# Patient Record
Sex: Female | Born: 1955 | Hispanic: Yes | Marital: Married | Smoking: Never smoker
Health system: Southern US, Community
[De-identification: ages and names within clinical notes are randomized; demographics above are authoritative.]

## PROBLEM LIST (undated history)

## (undated) DIAGNOSIS — E119 Type 2 diabetes mellitus without complications: Secondary | ICD-10-CM

## (undated) DIAGNOSIS — R7302 Impaired glucose tolerance (oral): Secondary | ICD-10-CM

## (undated) DIAGNOSIS — E785 Hyperlipidemia, unspecified: Secondary | ICD-10-CM

## (undated) DIAGNOSIS — M109 Gout, unspecified: Secondary | ICD-10-CM

## (undated) DIAGNOSIS — I10 Essential (primary) hypertension: Secondary | ICD-10-CM

## (undated) HISTORY — DX: Gout, unspecified: M10.9

## (undated) HISTORY — DX: Essential (primary) hypertension: I10

## (undated) HISTORY — DX: Hyperlipidemia, unspecified: E78.5

## (undated) HISTORY — DX: Type 2 diabetes mellitus without complications: E11.9

## (undated) HISTORY — DX: Impaired glucose tolerance (oral): R73.02

---

## 2005-10-16 ENCOUNTER — Ambulatory Visit: Payer: Self-pay | Admitting: Gastroenterology

## 2010-11-07 ENCOUNTER — Ambulatory Visit: Payer: Self-pay | Admitting: Obstetrics and Gynecology

## 2012-08-01 ENCOUNTER — Ambulatory Visit: Payer: Managed Care, Other (non HMO)

## 2012-08-01 ENCOUNTER — Ambulatory Visit (INDEPENDENT_AMBULATORY_CARE_PROVIDER_SITE_OTHER): Payer: Managed Care, Other (non HMO) | Admitting: Family Medicine

## 2012-08-01 VITALS — BP 136/96 | HR 83 | Temp 98.0°F | Resp 16 | Ht 63.0 in | Wt 167.0 lb

## 2012-08-01 DIAGNOSIS — R739 Hyperglycemia, unspecified: Secondary | ICD-10-CM

## 2012-08-01 DIAGNOSIS — L293 Anogenital pruritus, unspecified: Secondary | ICD-10-CM

## 2012-08-01 DIAGNOSIS — N898 Other specified noninflammatory disorders of vagina: Secondary | ICD-10-CM

## 2012-08-01 DIAGNOSIS — B9689 Other specified bacterial agents as the cause of diseases classified elsewhere: Secondary | ICD-10-CM

## 2012-08-01 DIAGNOSIS — M79609 Pain in unspecified limb: Secondary | ICD-10-CM

## 2012-08-01 DIAGNOSIS — M109 Gout, unspecified: Secondary | ICD-10-CM

## 2012-08-01 DIAGNOSIS — R7309 Other abnormal glucose: Secondary | ICD-10-CM

## 2012-08-01 DIAGNOSIS — N760 Acute vaginitis: Secondary | ICD-10-CM

## 2012-08-01 DIAGNOSIS — M79671 Pain in right foot: Secondary | ICD-10-CM

## 2012-08-01 LAB — POCT CBC
Granulocyte percent: 70.4 %G (ref 37–80)
HCT, POC: 47.2 % (ref 37.7–47.9)
MCV: 93.4 fL (ref 80–97)
POC LYMPH PERCENT: 24.2 %L (ref 10–50)
RDW, POC: 13.9 %

## 2012-08-01 LAB — POCT WET PREP WITH KOH
Trichomonas, UA: NEGATIVE
Yeast Wet Prep HPF POC: NEGATIVE

## 2012-08-01 LAB — POCT SEDIMENTATION RATE: POCT SED RATE: 35 mm/hr — AB (ref 0–22)

## 2012-08-01 MED ORDER — METRONIDAZOLE 0.75 % VA GEL: 1.0000 | Freq: Two times a day (BID) | VAGINAL | Status: DC

## 2012-08-01 MED ORDER — COLCHICINE 0.6 MG PO TABS: ORAL_TABLET | ORAL | Status: DC

## 2012-08-01 MED ORDER — IBUPROFEN 600 MG PO TABS: 600.0000 mg | ORAL_TABLET | Freq: Four times a day (QID) | ORAL | Status: DC | PRN

## 2012-08-01 NOTE — Patient Instructions (Addendum)
1. Pain of right foot  POCT SEDIMENTATION RATE, Uric Acid, DG Foot 2 Views Right, ibuprofen (ADVIL,MOTRIN) 600 MG tablet  2. Vaginal itching  POCT Wet Prep with KOH, GC/chlamydia probe amp, genital  3. Hyperglycemia  POCT CBC, POCT glucose (manual entry)  4. Gout of big toe  colchicine 0.6 MG tablet  5. Bacterial vaginosis  metroNIDAZOLE (METROGEL VAGINAL) 0.75 % vaginal gel

## 2012-08-01 NOTE — Progress Notes (Signed)
[address]   [place], [place]  [address]   [phone]  Subjective:    Patient ID: Monique Parker, female    DOB: 05/16/1956, 56 y.o.   MRN: [id number]  HPIThis 56 y.o. female presents for evaluation of the following:  1.  Gout:  R first toe pain; onset three days ago with pain.  Similar symptoms in past; diagnosed with gout.  Yesterday developed redness in R first MTP joint.  Last night suffered with horrible pain in toe.  +numbness in toes and painful.  Swollen.  Similar symptoms three years ago.  Second episode.  No alcohol.  Eating shrimp recently; rare red meat.  Eating a lot of beans.  No fever/chills/sweats.  Previously prescribed Allopurinol and Colchicine.    2. Vaginal itching:  Onset yesterday.  No vaginal discharge.  No recent antibiotics.  No dysuria.  No frequency.  Married; no new sexual partners.  Urgency.  No medications OTC.  Blood sugar running 90-120 fasting.  PCP:  Suzie Portela at [facility]; now assigned to new physician.  Looking for new PCP.   Review of Systems  Constitutional: Negative for fever, chills and fatigue.  Gastrointestinal: Negative for nausea, vomiting, abdominal pain and diarrhea.  Genitourinary: Positive for urgency. Negative for dysuria, frequency, hematuria, flank pain, vaginal bleeding, vaginal discharge and vaginal pain.  Musculoskeletal: Positive for joint swelling, arthralgias and gait problem.  Skin: Negative for rash.        Past Medical History  Diagnosis Date  . Hypertension   . Hyperlipidemia   . Gout   . Glucose intolerance (impaired glucose tolerance)   . Diabetes mellitus without complication     History reviewed. No pertinent past surgical history.  Prior to Admission medications   Medication Sig Start Date End Date Taking? Authorizing Provider  metFORMIN (GLUCOPHAGE) 500 MG tablet Take 500 mg by mouth daily with breakfast.   Yes Historical Provider, MD  simvastatin (ZOCOR) 20 MG tablet Take 20 mg by mouth every evening.   Yes  Historical Provider, MD  triamterene-hydrochlorothiazide (DYAZIDE) 37.5-25 MG per capsule Take 1 capsule by mouth every morning.   Yes Historical Provider, MD    Allergies  Allergen Reactions  . Penicillins Swelling    History   Social History  . Marital Status: Unknown    Spouse Name: N/A    Number of Children: N/A  . Years of Education: N/A   Occupational History  . Medical Assistant    Social History Main Topics  . Smoking status: Never Smoker   . Smokeless tobacco: Not on file  . Alcohol Use: No  . Drug Use: No  . Sexually Active: Yes   Other Topics Concern  . Not on file   Social History Narrative   Marital status: married   Children: none   Lives: with husband.  Employment:  Production designer, theatre/television/film at [employer]    Family History  Problem Relation Age of Onset  . Cancer Mother     Brain cancer  . Seizures Mother   . Diabetes Mother   . Arthritis Mother     Objective:   Physical Exam  Nursing note and vitals reviewed. Constitutional: She is oriented to person, place, and time. She appears well-developed and well-nourished. No distress.  Cardiovascular: Normal rate, regular rhythm and normal heart sounds.  Exam reveals no gallop and no friction rub.   No murmur heard. Pulmonary/Chest: Effort normal and breath sounds normal. She has no wheezes. She has no rales.  Abdominal: Soft. Bowel  sounds are normal. She exhibits no mass. There is no tenderness. There is no rebound and no guarding.  Genitourinary: Vagina normal and uterus normal. There is no rash, tenderness or lesion on the right labia. There is no rash, tenderness or lesion on the left labia. Cervix exhibits no motion tenderness, no discharge and no friability. Right adnexum displays no mass, no tenderness and no fullness. Left adnexum displays no mass, no tenderness and no fullness. No erythema, tenderness or bleeding around the vagina. No foreign body around the vagina. No vaginal discharge found.  Musculoskeletal:        R foot:  First MTP with swelling, warmth, erythema.  Decreased ROM first toe of R foot.  Non-tender to palpation of metatarsals. Non-tender metatarsal squeeze.  Neurological: She is alert and oriented to person, place, and time.  Skin: No rash noted. She is not diaphoretic. There is erythema. No pallor.  Psychiatric: She has a normal mood and affect. Her behavior is normal. Judgment and thought content normal.      [facility] reading (PRIMARY) by  Dr. Katrinka Blazing.  R foot:  NAD.   Results for orders placed in visit on 08/01/12  POCT CBC      Component Value Range   WBC 11.9 (*) 4.6 - 10.2 K/uL   Lymph, poc 2.9  0.6 - 3.4   POC LYMPH PERCENT 24.2  10 - 50 %L   MID (cbc) 0.6  0 - 0.9   POC MID % 5.4  0 - 12 %M   POC Granulocyte 8.4 (*) 2 - 6.9   Granulocyte percent 70.4  37 - 80 %G   RBC 5.05  4.04 - 5.48 M/uL   Hemoglobin 14.6  12.2 - 16.2 g/dL   HCT, POC 16.1  09.6 - 47.9 %   MCV 93.4  80 - 97 fL   MCH, POC 28.9  27 - 31.2 pg   MCHC 30.9 (*) 31.8 - 35.4 g/dL   RDW, POC 04.5     Platelet Count, POC 302  142 - 424 K/uL   MPV 10.7  0 - 99.8 fL  GLUCOSE, POCT (MANUAL RESULT ENTRY)      Component Value Range   POC Glucose 56 (*) 70 - 99 mg/dl  POCT WET PREP WITH KOH      Component Value Range   Trichomonas, UA Negative     Clue Cells Wet Prep HPF POC 4-12     Epithelial Wet Prep HPF POC 16-24     Yeast Wet Prep HPF POC neg     Bacteria Wet Prep HPF POC 3+     RBC Wet Prep HPF POC 3-9     WBC Wet Prep HPF POC 4-12     KOH Prep POC Negative       Assessment & Plan:   1. Pain of right foot  POCT SEDIMENTATION RATE, Uric Acid, DG Foot 2 Views Right, ibuprofen (ADVIL,MOTRIN) 600 MG tablet  2. Vaginal itching  POCT Wet Prep with KOH, GC/chlamydia probe amp, genital  3. Hyperglycemia  POCT CBC, POCT glucose (manual entry)  4. Gout of big toe  colchicine 0.6 MG tablet  5. Bacterial vaginosis  metroNIDAZOLE (METROGEL VAGINAL) 0.75 % vaginal gel     1.  Pain R first toe:  New.  Secondary  to acute gouty attack.  Rx for Ibuprofen 600mg  tid PRN. 2.  Acute gouty attack:  New.  Obtain Uric acid level. Rx for Colchicine 0.6mg  one po tid PRN.  First gouty attack in two years. 3.  Bacterial Vaginosis:  New.  Rx for Metrogel vaginal suppositories.  Call if no improvement after treatment. 4.  DMII: controlled with hypoglycemia currently; advised to eat after visit.

## 2012-08-02 ENCOUNTER — Other Ambulatory Visit: Payer: Self-pay | Admitting: Family Medicine

## 2012-08-03 LAB — GC/CHLAMYDIA PROBE AMP
CT Probe RNA: NEGATIVE
GC Probe RNA: NEGATIVE

## 2012-08-05 NOTE — Progress Notes (Signed)
Called patient to schedule appt. With Dr. Katrinka Blazing but patient can only come on Sundays so she prefers to walk in

## 2012-08-07 NOTE — Progress Notes (Signed)
Reviewed and agree.

## 2012-08-29 ENCOUNTER — Ambulatory Visit (INDEPENDENT_AMBULATORY_CARE_PROVIDER_SITE_OTHER): Payer: Managed Care, Other (non HMO) | Admitting: Emergency Medicine

## 2012-08-29 VITALS — BP 148/87 | HR 111 | Temp 98.4°F | Resp 16 | Ht 63.0 in | Wt 165.2 lb

## 2012-08-29 DIAGNOSIS — B029 Zoster without complications: Secondary | ICD-10-CM

## 2012-08-29 DIAGNOSIS — M109 Gout, unspecified: Secondary | ICD-10-CM

## 2012-08-29 DIAGNOSIS — I10 Essential (primary) hypertension: Secondary | ICD-10-CM

## 2012-08-29 MED ORDER — TRIAMTERENE-HCTZ 37.5-25 MG PO CAPS: 1.0000 | ORAL_CAPSULE | Freq: Every day | ORAL | Status: DC

## 2012-08-29 MED ORDER — COLCHICINE 0.6 MG PO TABS: ORAL_TABLET | ORAL | Status: DC

## 2012-08-29 MED ORDER — INDOMETHACIN 25 MG PO CAPS: 25.0000 mg | ORAL_CAPSULE | Freq: Three times a day (TID) | ORAL | Status: DC

## 2012-08-29 MED ORDER — ALLOPURINOL 100 MG PO TABS: 100.0000 mg | ORAL_TABLET | Freq: Every day | ORAL | Status: DC

## 2012-08-29 MED ORDER — VALACYCLOVIR HCL 1 G PO TABS: 1000.0000 mg | ORAL_TABLET | Freq: Two times a day (BID) | ORAL | Status: DC

## 2012-08-29 NOTE — Patient Instructions (Signed)
Gout Gout is an inflammatory condition (arthritis) caused by a buildup of uric acid crystals in the joints. Uric acid is a chemical that is normally present in the blood. Under some circumstances, uric acid can form into crystals in your joints. This causes joint redness, soreness, and swelling (inflammation). Repeat attacks are common. Over time, uric acid crystals can form into masses (tophi) near a joint, causing disfigurement. Gout is treatable and often preventable. CAUSES  The disease begins with elevated levels of uric acid in the blood. Uric acid is produced by your body when it breaks down a naturally found substance called purines. This also happens when you eat certain foods such as meats and fish. Causes of an elevated uric acid level include:  Being passed down from parent to child (heredity).  Diseases that cause increased uric acid production (obesity, psoriasis, some cancers).  Excessive alcohol use.  Diet, especially diets rich in meat and seafood.  Medicines, including certain cancer-fighting drugs (chemotherapy), diuretics, and aspirin.  Chronic kidney disease. The kidneys are no longer able to remove uric acid well.  Problems with metabolism. Conditions strongly associated with gout include:  Obesity.  High blood pressure.  High cholesterol.  Diabetes. Not everyone with elevated uric acid levels gets gout. It is not understood why some people get gout and others do not. Surgery, joint injury, and eating too much of certain foods are some of the factors that can lead to gout. SYMPTOMS   An attack of gout comes on quickly. It causes intense pain with redness, swelling, and warmth in a joint.  Fever can occur.  Often, only one joint is involved. Certain joints are more commonly involved:  Base of the big toe.  Knee.  Ankle.  Wrist.  Finger. Without treatment, an attack usually goes away in a few days to weeks. Between attacks, you usually will not have  symptoms, which is different from many other forms of arthritis. DIAGNOSIS  Your caregiver will suspect gout based on your symptoms and exam. Removal of fluid from the joint (arthrocentesis) is done to check for uric acid crystals. Your caregiver will give you a medicine that numbs the area (local anesthetic) and use a needle to remove joint fluid for exam. Gout is confirmed when uric acid crystals are seen in joint fluid, using a special microscope. Sometimes, blood, urine, and X-ray tests are also used. TREATMENT  There are 2 phases to gout treatment: treating the sudden onset (acute) attack and preventing attacks (prophylaxis). Treatment of an Acute Attack  Medicines are used. These include anti-inflammatory medicines or steroid medicines.  An injection of steroid medicine into the affected joint is sometimes necessary.  The painful joint is rested. Movement can worsen the arthritis.  You may use warm or cold treatments on painful joints, depending which works best for you.  Discuss the use of coffee, vitamin C, or cherries with your caregiver. These may be helpful treatment options. Treatment to Prevent Attacks After the acute attack subsides, your caregiver may advise prophylactic medicine. These medicines either help your kidneys eliminate uric acid from your body or decrease your uric acid production. You may need to stay on these medicines for a very long time. The early phase of treatment with prophylactic medicine can be associated with an increase in acute gout attacks. For this reason, during the first few months of treatment, your caregiver may also advise you to take medicines usually used for acute gout treatment. Be sure you understand your caregiver's directions.   You should also discuss dietary treatment with your caregiver. Certain foods such as meats and fish can increase uric acid levels. Other foods such as dairy can decrease levels. Your caregiver can give you a list of foods  to avoid. HOME CARE INSTRUCTIONS   Do not take aspirin to relieve pain. This raises uric acid levels.  Only take over-the-counter or prescription medicines for pain, discomfort, or fever as directed by your caregiver.  Rest the joint as much as possible. When in bed, keep sheets and blankets off painful areas.  Keep the affected joint raised (elevated).  Use crutches if the painful joint is in your leg.  Drink enough water and fluids to keep your urine clear or pale yellow. This helps your body get rid of uric acid. Do not drink alcoholic beverages. They slow the passage of uric acid.  Follow your caregiver's dietary instructions. Pay careful attention to the amount of protein you eat. Your daily diet should emphasize fruits, vegetables, whole grains, and fat-free or low-fat milk products.  Maintain a healthy body weight. SEEK MEDICAL CARE IF:   You have an oral temperature above 102 F (38.9 C).  You develop diarrhea, vomiting, or any side effects from medicines.  You do not feel better in 24 hours, or you are getting worse. SEEK IMMEDIATE MEDICAL CARE IF:   Your joint becomes suddenly more tender and you have:  Chills.  An oral temperature above 102 F (38.9 C), not controlled by medicine. MAKE SURE YOU:   Understand these instructions.  Will watch your condition.  Will get help right away if you are not doing well or get worse. Document Released: 08/15/2000 Document Revised: 11/10/2011 Document Reviewed: 11/26/2009 Exit[employer] Patient Information 2013 [employer].    

## 2012-08-29 NOTE — Progress Notes (Signed)
[facility] [address], [place] [address] [phone]  Date:  08/29/2012   Name:  Monique Parker   DOB:  12/22/1955   MRN:  [id number]  PCP:  No primary provider on file.    Chief Complaint: Rash   History of Present Illness:  Monique Parker is a 56 y.o. very pleasant female patient who presents with the following:  Multiple complaints.  Has a painful pruritic rash on the left back that has been present for 7 days.  vesiclur in nature and now crusted.  No fever or chills but has malaise and myalgias.  Was recently treated for gout and now has recurrence involving both great toes.  Has been using allopurinal for treatment.  No history of injury or overuse  There is no problem list on file for this patient.   Past Medical History  Diagnosis Date  . Hypertension   . Hyperlipidemia   . Gout   . Glucose intolerance (impaired glucose tolerance)   . Diabetes mellitus without complication     No past surgical history on file.  History  Substance Use Topics  . Smoking status: Never Smoker   . Smokeless tobacco: Not on file  . Alcohol Use: No    Family History  Problem Relation Age of Onset  . Cancer Mother     Brain cancer  . Seizures Mother   . Diabetes Mother   . Arthritis Mother     Allergies  Allergen Reactions  . Penicillins Swelling    Medication list has been reviewed and updated.  Current Outpatient Prescriptions on File Prior to Visit  Medication Sig Dispense Refill  . metFORMIN (GLUCOPHAGE) 500 MG tablet Take 500 mg by mouth daily with breakfast.      . metroNIDAZOLE (METROGEL VAGINAL) 0.75 % vaginal gel Place 1 Applicatorful vaginally 2 (two) times daily.  70 g  0  . triamterene-hydrochlorothiazide (DYAZIDE) 37.5-25 MG per capsule Take 1 each (1 capsule total) by mouth daily. Needs office visit/labs  15 capsule  0  . colchicine 0.6 MG tablet One tablet three times daily x 5 days for gouty pain  30 tablet  2  . ibuprofen  (ADVIL,MOTRIN) 600 MG tablet Take 1 tablet (600 mg total) by mouth every 6 (six) hours as needed for pain.  40 tablet  0  . simvastatin (ZOCOR) 20 MG tablet Take 20 mg by mouth every evening.        Review of Systems:  As per HPI, otherwise negative.    Physical Examination: Filed Vitals:   08/29/12 1630  BP: 148/87  Pulse: 111  Temp: 98.4 F (36.9 C)  Resp: 16   Filed Vitals:   08/29/12 1630  Height: 5\' 3"  (1.6 m)  Weight: 165 lb 3.2 oz (74.934 kg)   Body mass index is 29.26 kg/(m^2). Ideal Body Weight: Weight in (lb) to have BMI = 25: 140.8    GEN: WDWN, NAD, Non-toxic, Alert & Oriented x 3 HEENT: Atraumatic, Normocephalic.  Ears and Nose: No external deformity. EXTR: No clubbing/cyanosis/edema NEURO: Normal gait.  PSYCH: Normally interactive. Conversant. Not depressed or anxious appearing.  Calm demeanor.  CHEST:  Benign.  BS-=  Rash characteristic of shingles on left posterior chest Feet:  Great toe MTP joints tender and swollen and erythematous.  Assessment and Plan: Gout Shingles Valtrex colcrys Indocin Follow up as needed  Carmelina Dane, MD

## 2012-08-30 NOTE — Progress Notes (Signed)
Reviewed and agree.

## 2012-10-04 ENCOUNTER — Ambulatory Visit: Payer: Managed Care, Other (non HMO) | Admitting: Family Medicine

## 2012-12-12 ENCOUNTER — Ambulatory Visit (INDEPENDENT_AMBULATORY_CARE_PROVIDER_SITE_OTHER): Payer: Managed Care, Other (non HMO) | Admitting: Internal Medicine

## 2012-12-12 VITALS — BP 171/93 | HR 66 | Temp 97.7°F | Resp 16 | Ht 64.0 in | Wt 164.0 lb

## 2012-12-12 DIAGNOSIS — M109 Gout, unspecified: Secondary | ICD-10-CM

## 2012-12-12 DIAGNOSIS — E785 Hyperlipidemia, unspecified: Secondary | ICD-10-CM | POA: Insufficient documentation

## 2012-12-12 DIAGNOSIS — I10 Essential (primary) hypertension: Secondary | ICD-10-CM

## 2012-12-12 DIAGNOSIS — E119 Type 2 diabetes mellitus without complications: Secondary | ICD-10-CM

## 2012-12-12 DIAGNOSIS — Z6828 Body mass index (BMI) 28.0-28.9, adult: Secondary | ICD-10-CM | POA: Insufficient documentation

## 2012-12-12 DIAGNOSIS — M5412 Radiculopathy, cervical region: Secondary | ICD-10-CM

## 2012-12-12 LAB — COMPREHENSIVE METABOLIC PANEL
Alkaline Phosphatase: 62 U/L (ref 39–117)
BUN: 18 mg/dL (ref 6–23)
Creat: 0.82 mg/dL (ref 0.50–1.10)
Glucose, Bld: 90 mg/dL (ref 70–99)
Total Bilirubin: 0.5 mg/dL (ref 0.3–1.2)

## 2012-12-12 LAB — POCT CBC
HCT, POC: 42.5 % (ref 37.7–47.9)
Lymph, poc: 2.5 (ref 0.6–3.4)
MCH, POC: 29.4 pg (ref 27–31.2)
MCHC: 32.2 g/dL (ref 31.8–35.4)
MCV: 91.1 fL (ref 80–97)
POC Granulocyte: 4 (ref 2–6.9)
POC LYMPH PERCENT: 35.8 %L (ref 10–50)
RDW, POC: 14.5 %
WBC: 7.1 10*3/uL (ref 4.6–10.2)

## 2012-12-12 LAB — LIPID PANEL
Cholesterol: 211 mg/dL — ABNORMAL HIGH (ref 0–200)
Triglycerides: 223 mg/dL — ABNORMAL HIGH (ref <150)

## 2012-12-12 MED ORDER — LISINOPRIL 20 MG PO TABS: 20.0000 mg | ORAL_TABLET | Freq: Every day | ORAL | Status: DC

## 2012-12-12 MED ORDER — ALLOPURINOL 100 MG PO TABS: 100.0000 mg | ORAL_TABLET | Freq: Every day | ORAL | Status: DC

## 2012-12-12 MED ORDER — CYCLOBENZAPRINE HCL 10 MG PO TABS: 10.0000 mg | ORAL_TABLET | Freq: Every day | ORAL | Status: DC

## 2012-12-12 MED ORDER — COLCHICINE 0.6 MG PO TABS: ORAL_TABLET | ORAL | Status: DC

## 2012-12-12 NOTE — Progress Notes (Addendum)
Subjective:    Patient ID: Monique Parker, female    DOB: Oct 23, 1955, 57 y.o.   MRN: [id number]  HPI complaining of pain in the right great toe for 4 days/no known injury/history of gout in his toe with last incident December 2013 No shellfish, no red meat, little high fructose corn syrup, no alcohol Continues on thiazide diuretic Continues allopurinol  Patient Active Problem List  Diagnosis  . Gout  . HTN (hypertension)  . DM (diabetes mellitus)  . Other and unspecified hyperlipidemia  . BMI 28.0-28.9,adult  Current outpatient prescriptions:triamterene -hydrochlorothiazide (DYAZIDE) 37.5-25 MG per capsule, Take 1 each (1 capsule total) by mouth daily., Disp: 30 capsule, Rfl: 5;   allopurinol (ZYLOPRIM) 100 MG tablet, Take 1 tablet (100 mg total) by mouth daily., Disp: 30 tablet, ibuprofen (ADVIL,MOTRIN) 600 MG tablet, Take 1 tablet (600 mg total) by mouth every 6 (six) hours as needed for pain., Disp: 40 tablet, Rfl: 0 metFORMIN (GLUCOPHAGE) 500 MG tablet, Take 500 mg by mouth daily with breakfast., Disp: , Rfl: -simvastatin (ZOCOR) 20 MG tablet, Take 20 mg by mouth every evening  Also complaining of pain in the right shoulder arm and back for one to 2 weeks, worse with activity No problems with sleep/no known injury   Review of Systems No fever chills or night sweats Chest pain or palpitations No edema No vision changes No urinary problems    Objective:   Physical Exam BP 171/93  Pulse 66  Temp(Src) 97.7 F (36.5 C) (Oral)  Resp 16  Ht 5\' 4"  (1.626 m)  Wt 164 lb (74.39 kg)  BMI 28.14 kg/m2 Range of motion of the neck causes pain in the right trapezius and parascapular area with neck extension/other maneuvers were okay No sensory or motor losses in the right upper extremity Pupils equal round reactive to light and accommodation No thyromegaly Heart regular without murmur Lungs clear Right great toe red and swollen and tender to touch or manipulation       Results  for orders placed in visit on 12/12/12  POCT CBC      Result Value Range   WBC 7.1  4.6 - 10.2 K/uL   Lymph, poc 2.5  0.6 - 3.4   POC LYMPH PERCENT 35.8  10 - 50 %L   MID (cbc) 0.6  0 - 0.9   POC MID % 7.9  0 - 12 %M   POC Granulocyte 4.0  2 - 6.9   Granulocyte percent 56.3  37 - 80 %G   RBC 4.66  4.04 - 5.48 M/uL   Hemoglobin 13.7  12.2 - 16.2 g/dL   HCT, POC 82.9  56.2 - 47.9 %   MCV 91.1  80 - 97 fL   MCH, POC 29.4  27 - 31.2 pg   MCHC 32.2  31.8 - 35.4 g/dL   RDW, POC 13.0     Platelet Count, POC 263  142 - 424 K/uL   MPV 10.4  0 - 99.8 fL  POCT GLYCOSYLATED HEMOGLOBIN (HGB A1C)      Result Value Range   Hemoglobin A1C 5.9      Assessment & Plan:  Gout - Plan: POCT CBC, Uric Acid, allopurinol (ZYLOPRIM) 100 MG tablet=increased to BID, colchicine 0.6 MG tablet bid til well  Discontinue diazide HTN (hypertension) - Plan: lisinopril (PRINIVIL,ZESTRIL) 20 MG tablet///home blood pressures to ensure control  DM (diabetes mellitus) - Plan: POCT CBC, Comprehensive metabolic panel, POCT glycosylated hemoglobin (Hb A1C)=controlled!!!  Other and unspecified hyperlipidemia -  Plan: Lipid panel  BMI 28.0-28.9,adult  Cervical radicular pain - Plan: cyclobenzaprine (FLEXERIL) 10 MG tablet/exercises given-f/u 3 weeks for imaging if not better  Meds ordered this encounter  Medications  . cyclobenzaprine (FLEXERIL) 10 MG tablet    Sig: Take 1 tablet (10 mg total) by mouth at bedtime.    Dispense:  30 tablet    Refill:  0  . allopurinol (ZYLOPRIM) 100 MG tablet    Sig: Take 1 tablet (100 mg total) by mouth daily.    Dispense:  60 tablet    Refill:  5  . colchicine 0.6 MG tablet    Sig: One twice a day til pain controlled    Dispense:  14 tablet    Refill:  0  . lisinopril (PRINIVIL,ZESTRIL) 20 MG tablet    Sig: Take 1 tablet (20 mg total) by mouth daily.    Dispense:  90 tablet    Refill:  3   Recheck in one to 3 months

## 2012-12-12 NOTE — Patient Instructions (Signed)
Increase allopurinol to 1 tablet twice a day to help prevent another gout attack Stop Dyazide and start lisinopril for blood pressure control Use colchicine 1 tablet twice a day until gout resolves Continue metformin daily Continue Zocor daily Recheck labs in 6 months Exercises for neck plus muscle relaxer Flexeril at bedtime for 2-3 weeks

## 2012-12-13 ENCOUNTER — Encounter: Payer: Self-pay | Admitting: Internal Medicine

## 2012-12-13 MED ORDER — SIMVASTATIN 40 MG PO TABS: 40.0000 mg | ORAL_TABLET | Freq: Every evening | ORAL | Status: DC

## 2013-06-23 ENCOUNTER — Ambulatory Visit: Payer: Self-pay | Admitting: Physician Assistant

## 2014-03-19 ENCOUNTER — Ambulatory Visit (INDEPENDENT_AMBULATORY_CARE_PROVIDER_SITE_OTHER): Payer: Managed Care, Other (non HMO) | Admitting: Emergency Medicine

## 2014-03-19 VITALS — BP 136/80 | HR 76 | Temp 98.2°F | Resp 20 | Ht 62.5 in | Wt 168.8 lb

## 2014-03-19 DIAGNOSIS — G5760 Lesion of plantar nerve, unspecified lower limb: Secondary | ICD-10-CM

## 2014-03-19 DIAGNOSIS — G5761 Lesion of plantar nerve, right lower limb: Secondary | ICD-10-CM

## 2014-03-19 MED ORDER — NAPROXEN SODIUM 550 MG PO TABS: 550.0000 mg | ORAL_TABLET | Freq: Two times a day (BID) | ORAL | Status: DC

## 2014-03-19 NOTE — Progress Notes (Signed)
[facility] [address], [place] [id number] [phone]  Date:  03/19/2014   Name:  Monique ShihMaribel A Parker   DOB:  04/24/1956   MRN:  [id number]  PCP:  No primary provider on file.    Chief Complaint: Knee Pain   History of Present Illness:  Monique Parker is a 58 y.o. very pleasant female patient who presents with the following:  Patient with history of gout.  Has sudden pain in 2-3td toe on right foot.  No history of injury.  Was wearing sandals when pain started. Denies other complaint or health concern today.   Patient Active Problem List   Diagnosis Date Noted  . Gout 12/12/2012  . HTN (hypertension) 12/12/2012  . DM (diabetes mellitus) 12/12/2012  . Other and unspecified hyperlipidemia 12/12/2012  . BMI 28.0-28.9,adult 12/12/2012    Past Medical History  Diagnosis Date  . Hypertension   . Hyperlipidemia   . Gout   . Glucose intolerance (impaired glucose tolerance)   . Diabetes mellitus without complication     No past surgical history on file.  History  Substance Use Topics  . Smoking status: Never Smoker   . Smokeless tobacco: Not on file  . Alcohol Use: No    Family History  Problem Relation Age of Onset  . Cancer Mother     Brain cancer  . Seizures Mother   . Diabetes Mother   . Arthritis Mother     Allergies  Allergen Reactions  . Penicillins Swelling    Medication list has been reviewed and updated.  Current Outpatient Prescriptions on File Prior to Visit  Medication Sig Dispense Refill  . ibuprofen (ADVIL,MOTRIN) 600 MG tablet Take 1 tablet (600 mg total) by mouth every 6 (six) hours as needed for pain.  40 tablet  0  . allopurinol (ZYLOPRIM) 100 MG tablet Take 1 tablet (100 mg total) by mouth daily.  60 tablet  5  . colchicine 0.6 MG tablet One twice a day til pain controlled  14 tablet  0   No current facility-administered medications on file prior to visit.    Review of Systems:  As per HPI, otherwise  negative.    Physical Examination: Filed Vitals:   03/19/14 1601  BP: 136/80  Pulse: 76  Temp: 98.2 F (36.8 C)  Resp: 20   Filed Vitals:   03/19/14 1601  Height: 5' 2.5" (1.588 m)  Weight: 168 lb 12.8 oz (76.567 kg)   Body mass index is 30.36 kg/(m^2). Ideal Body Weight: Weight in (lb) to have BMI = 25: 138.6   GEN: WDWN, NAD, Non-toxic, Alert & Oriented x 3 HEENT: Atraumatic, Normocephalic.  Ears and Nose: No external deformity. EXTR: No clubbing/cyanosis/edema NEURO: Normal gait.  PSYCH: Normally interactive. Conversant. Not depressed or anxious appearing.  Calm demeanor.  Localized tenderness between 2-3rd MT heads.  No ecchymosis, erythema or swelling.   Assessment and Plan: Morton neuroma Anaprox   Signed,  Phillips OdorJeffery Archana Eckman, MD

## 2014-03-19 NOTE — Patient Instructions (Signed)
Neuroma de Morton (Morton's Neuroma) La neuralgia (dolor nervioso) o neuroma (tumor nervioso benigno [no canceroso]) puede desarrollarse en cualquier nervio interdigital. Los nervios interdigitales (los que se encuentran entre los dedos) del pie viajan por debajo y The Krogerentre los huesos metatarsos (los huesos largos de la parte delantera del pie) y Wayne Bothvan hacia las terminaciones nerviosas de los dedos de los pies. El tercer nervio interdigital es un lugar comn para que se forme un pequeo neuroma denominado neuroma de Morton. Otro nervio generalmente afectado es el cuarto nervio interdigital. Se encuentra aproximadamente en la zona de la planta del pie, debajo del cuarto dedo. Este trastorno se produce con ms frecuencia en las mujeres y generalmente se presenta de un lado. Generalmente se advierte primero como un dolor que se irradia (se disemina) en la planta del pie o hacia los dedos.  CAUSAS La causa de la neuralgia interdigital puede deberse a un traumatismo (lesin causada por un accidente) repetitivo menor como en aquellas actividades que causan un golpeteo repetido en el pie (correr, saltar, etc.). Otra de las causas puede ser un calzado inadecuado y la prdida reciente de la almohadilla grasa en la planta del pie. TRATAMIENTO Este trastorno generalmente se resuelve (desaparece) simplemente disminuyendo la actividad, si se considera que esta es la causa. Es beneficioso usar el calzado adecuado. Algunos aparatos ortopdicos (soportes especiales para el pie) como la barra metatarsal generalmente son de gran ayuda. Este trastorno generalmente responde a la terapia conservadora, sin embargo cuando es necesario someterse a una ciruga, el alivio es total. INSTRUCCIONES PARA EL CUIDADO DOMICILIARIO  Aplique hielo sobre la zona sensible durante 15 a 20 minutos, 3 a 4 veces por Comcastda mientras se encuentre despierto, durante los 2 primeros [address]  [id number]. Ponga el hielo en una bolsa plstica y coloque una toalla entre la bolsa  y la piel.  Utilice los medicamentos de venta libre o de prescripcin para Chief Technology Officerel dolor, Environmental health practitionerel malestar o la Passapatanzyfiebre, segn se lo indique el profesional que lo asiste. EST SEGURO QUE:   Comprende las instrucciones para el alta mdica.  Controlar su enfermedad.  Solicitar atencin mdica de inmediato segn las indicaciones. Document Released: 08/18/2005 Document Revised: 11/10/2011 [facility] Patient Information 2015 [place]oundExitCare, MarylandLLC. This information is not intended to replace advice given to you by your health care provider. Make sure you discuss any questions you have with your health care provider.

## 2014-05-21 ENCOUNTER — Other Ambulatory Visit: Payer: Self-pay | Admitting: Emergency Medicine

## 2014-08-22 ENCOUNTER — Ambulatory Visit: Payer: Self-pay | Admitting: Family Medicine

## 2014-09-03 ENCOUNTER — Ambulatory Visit (INDEPENDENT_AMBULATORY_CARE_PROVIDER_SITE_OTHER): Payer: Managed Care, Other (non HMO) | Admitting: Family Medicine

## 2014-09-03 VITALS — BP 120/82 | HR 82 | Temp 97.8°F | Resp 18 | Ht 62.5 in | Wt 171.0 lb

## 2014-09-03 DIAGNOSIS — R7302 Impaired glucose tolerance (oral): Secondary | ICD-10-CM

## 2014-09-03 DIAGNOSIS — K1379 Other lesions of oral mucosa: Secondary | ICD-10-CM

## 2014-09-03 DIAGNOSIS — M10071 Idiopathic gout, right ankle and foot: Secondary | ICD-10-CM

## 2014-09-03 DIAGNOSIS — M79671 Pain in right foot: Secondary | ICD-10-CM

## 2014-09-03 LAB — COMPREHENSIVE METABOLIC PANEL
ALK PHOS: 89 U/L (ref 39–117)
ALT: 17 U/L (ref 0–35)
AST: 27 U/L (ref 0–37)
Albumin: 4.4 g/dL (ref 3.5–5.2)
BILIRUBIN TOTAL: 0.3 mg/dL (ref 0.2–1.2)
BUN: 19 mg/dL (ref 6–23)
CO2: 27 meq/L (ref 19–32)
CREATININE: 0.77 mg/dL (ref 0.50–1.10)
Calcium: 9.7 mg/dL (ref 8.4–10.5)
Chloride: 104 mEq/L (ref 96–112)
Glucose, Bld: 81 mg/dL (ref 70–99)
Potassium: 4.1 mEq/L (ref 3.5–5.3)
Sodium: 140 mEq/L (ref 135–145)
TOTAL PROTEIN: 8 g/dL (ref 6.0–8.3)

## 2014-09-03 LAB — URIC ACID: Uric Acid, Serum: 7 mg/dL (ref 2.4–7.0)

## 2014-09-03 LAB — CBC
HCT: 45 % (ref 36.0–46.0)
Hemoglobin: 15.2 g/dL — ABNORMAL HIGH (ref 12.0–15.0)
MCH: 29.8 pg (ref 26.0–34.0)
MCHC: 33.8 g/dL (ref 30.0–36.0)
MCV: 88.2 fL (ref 78.0–100.0)
MPV: 10.8 fL (ref 8.6–12.4)
PLATELETS: 251 10*3/uL (ref 150–400)
RBC: 5.1 MIL/uL (ref 3.87–5.11)
RDW: 14 % (ref 11.5–15.5)
WBC: 8 10*3/uL (ref 4.0–10.5)

## 2014-09-03 LAB — GLUCOSE, POCT (MANUAL RESULT ENTRY): POC Glucose: 77 mg/dl (ref 70–99)

## 2014-09-03 MED ORDER — COLCHICINE 0.6 MG PO TABS: ORAL_TABLET | ORAL | Status: DC

## 2014-09-03 MED ORDER — CLINDAMYCIN HCL 150 MG PO CAPS
150.0000 mg | ORAL_CAPSULE | Freq: Three times a day (TID) | ORAL | Status: DC
Start: 2014-09-03 — End: 2016-05-10

## 2014-09-03 MED ORDER — ALLOPURINOL 100 MG PO TABS: 100.0000 mg | ORAL_TABLET | Freq: Every day | ORAL | Status: DC

## 2014-09-03 NOTE — Progress Notes (Addendum)
Subjective:  This chart was scribed for Monique Simmer, MD by Haywood Pao, ED Scribe at [facility].The patient was seen in exam room 12 and the patient's care was started at 1:05 PM.   Patient ID: Monique Parker, female    DOB: [birth year], 59 y.o.   MRN: [id number]  09/03/2014  Gout and Dental Pain    HPI  HPI Comments: Monique Parker is a 59 y.o. female who presents to [facility] complaining of dental pain onset two weeks. She went to a maxillary-facial specialist two years ago for pain in the upper part of her mouth. Pt was told her if this  worsens she may need surgery. Two weeks ago she was brushing her teeth and injured the top of her mouth. Her husband noticed she has a sore and bleeding at the site of injury. She has gargled with salt water for relief.  Wound persists.  Pt also complains of gout in her right foot, yesterday she began feeling a pinching sensation in her toe. She does not have pain now It is more red than normal. She is taking colchicine once a day for relief. Pt has a gout flare up 4-5 times a year. Pt is out of her allopurinol which she typically takes. Her last x-ray was 08/01/2012. She needs a prescription refill for colchicine and allopurinol. Pt has had gout for 3 years.  No recent uric acid level by PCP in [place].  Pt currently takes lisinopril and reports having a cough as a side effect.  Diuretic was discontinued due to gout.   Her PCP is at the [facility] in [place], pt also lives in [place]. Pt is pre-diabetic and does have her blood sugar levels checked regularly at [place] urgent care. Pt has not gotten the flu shot and refuses one today.  Requesting sugar being checked due to recent holidays.    Review of Systems  Constitutional: Negative for fever, chills, diaphoresis and fatigue.  HENT: Positive for dental problem and mouth sores. Negative for congestion, drooling, ear discharge, ear pain, postnasal drip, rhinorrhea, sinus pressure, sore  throat, trouble swallowing and voice change.   Respiratory: Positive for cough. Negative for shortness of breath.   Musculoskeletal: Positive for joint swelling and arthralgias.  Skin: Positive for color change. Negative for rash.    Past Medical History  Diagnosis Date  . Hypertension   . Hyperlipidemia   . Gout   . Glucose intolerance (impaired glucose tolerance)   . Diabetes mellitus without complication    History reviewed. No pertinent past surgical history. Allergies  Allergen Reactions  . Penicillins Swelling   Current Outpatient Prescriptions  Medication Sig Dispense Refill  . allopurinol (ZYLOPRIM) 100 MG tablet Take 1 tablet (100 mg total) by mouth daily. 30 tablet 5  . colchicine 0.6 MG tablet One twice to three times daily until gout pain is gone 30 tablet 2  . lisinopril (PRINIVIL,ZESTRIL) 20 MG tablet Take 20 mg by mouth daily.    . clindamycin (CLEOCIN) 150 MG capsule Take 1 capsule (150 mg total) by mouth 3 (three) times daily. 21 capsule 0  . ibuprofen (ADVIL,MOTRIN) 600 MG tablet Take 1 tablet (600 mg total) by mouth every 6 (six) hours as needed for pain. (Patient not taking: Reported on 09/03/2014) 40 tablet 0  . naproxen sodium (ANAPROX DS) 550 MG tablet Take 1 tablet (550 mg total) by mouth 2 (two) times daily with a meal. (Patient not taking: Reported on  09/03/2014) 40 tablet 0  . triamterene-hydrochlorothiazide (DYAZIDE) 37.5-25 MG per capsule Take 1 capsule by mouth daily.     No current facility-administered medications for this visit.       Objective:    BP 120/82 mmHg  Pulse 82  Temp(Src) 97.8 F (36.6 C) (Oral)  Resp 18  Ht 5' 2.5" (1.588 m)  Wt 171 lb (77.565 kg)  BMI 30.76 kg/m2  SpO2 99% Physical Exam  Constitutional: She is oriented to person, place, and time. She appears well-developed and well-nourished. No distress.  HENT:  Head: Normocephalic and atraumatic.  4 mm wound with surrounding swelling with no erythema or fluctuant on the soft  pallet.  Eyes: Conjunctivae and EOM are normal. Pupils are equal, round, and reactive to light.  Neck: Normal range of motion. No thyromegaly present.  Cardiovascular: Normal rate, regular rhythm and normal heart sounds.   No murmur heard. Pulmonary/Chest: Effort normal and breath sounds normal. No respiratory distress. She has no wheezes. She has no rales.  Musculoskeletal:  First R MTP swelling and erythema with no pain/tenderness. There is a bunion formation at first R MTP.  Neurological: She is alert and oriented to person, place, and time. No cranial nerve deficit. She exhibits normal muscle tone. Coordination normal.  Skin: Skin is warm and dry. There is erythema.  Psychiatric: She has a normal mood and affect. Her behavior is normal.  Nursing note and vitals reviewed.  Results for orders placed or performed in visit on 09/03/14  POCT glucose (manual entry)  Result Value Ref Range   POC Glucose 77 70 - 99 mg/dl      Assessment & Plan:   1. Acute idiopathic gout of right foot   2. Pain in right foot   3. Mouth sore   4. Glucose intolerance (impaired glucose tolerance)      1.  R gout with acute exacerbation:  New.  Obtain uric acid level, CBC; refill of Allopurinol and Colchicine provided; reviewed use of both medications. 2.  Pain in R foot at first MTP: Recurrent; secondary to gouty attack. 3.  Mouth sore/lesion traumatic: New.  With delayed healing; rx for Clindamycin provided; continue with warm salt water gargles.  If no improvement in two weeks, follow-up with dentist. 4.  Glucose intolerance; stable despite holiday eating.    Meds ordered this encounter  Medications  . lisinopril (PRINIVIL,ZESTRIL) 20 MG tablet    Sig: Take 20 mg by mouth daily.  . clindamycin (CLEOCIN) 150 MG capsule    Sig: Take 1 capsule (150 mg total) by mouth 3 (three) times daily.    Dispense:  21 capsule    Refill:  0  . colchicine 0.6 MG tablet    Sig: One twice to three times daily  until gout pain is gone    Dispense:  30 tablet    Refill:  2  . allopurinol (ZYLOPRIM) 100 MG tablet    Sig: Take 1 tablet (100 mg total) by mouth daily.    Dispense:  30 tablet    Refill:  5    No Follow-up on file.    I personally performed the services described in this documentation, which was scribed in my presence. The recorded information has been reviewed and considered.   Kristi Paulita Fujita, M.D. [facility] [address] [place], [place]  [address] [phone] phone [phone] fax

## 2014-09-03 NOTE — Patient Instructions (Signed)
Monique Parker  °(Gout) ° [name] es una artritis inflamatoria originada por la acumulación de cristales de ácido úrico en las articulaciones. El ácido úrico es una sustancia química que normalmente se encuentra en la sangre. Cuando los niveles de ácido úrico en la sangre son muy elevados, pueden formarse cristales que se depositan en las articulaciones y los tejidos. Esto causa irritación, dolor e hinchazón (inflamación). La repetición de los ataques es frecuente. Con el tiempo, los cristales de ácido úrico pueden formar masas (tofos) cerca de una articulación, destruyen el hueso y provocan una deformidad [name] es una enfermedad que puede tratarse y con frecuencia puede prevenirse. °CAUSAS  °La enfermedad comienza con niveles altos de ácido úrico en la sangre. El organismo produce ácido úrico cuando metaboliza una sustancia que se encuentra en estado natural, denominada purina. Ciertos alimentos, como carnes y pescado, contienen grandes cantidades de purinas. Las causas de ácido úrico elevado son:  °· Ser transmitida de padres a hijos (hereditaria). °· Enfermedades que causan un aumento de la producción de ácido úrico (como la obesidad, la psoriasis y ciertos tipos de cáncer). °· Abuso en el consumo de bebidas alcohólicas. °· La dieta, especialmente las dietas en las que se consume mucha carne y frutos de mar. °· Ciertos medicamentos, como los que combaten el cáncer (quimioterapia), los diuréticos y la aspirina. °· Enfermedades renales crónicas. Los riñones no pueden eliminar bien el ácido úrico. °· Problemas con el metabolismo. °Las enfermedades fuertemente asociadas a [name] son:  °· Obesidad. °· Hipertensión arterial. °· Colesterol elevado. °· Diabetes. °No todas las personas con niveles elevados de ácido úrico sufren Monique Parker. No se comprende aún por qué algunas personas padecen Monique Parker y otras no. Las cirugías, lesiones en una articulación y el consumo excesivo de ciertos alimentos son algunos de los factores que pueden  provocar ataques de Monique Parker.  °SÍNTOMAS  °· Un ataque de Monique Parker puede comenzar rápidamente. Causa un dolor intenso, con irritación, hinchazón y calor en una articulación. °· Puede haber fiebre. °· Generalmente sólo una articulación es afectada. Ciertas articulaciones se ven implicadas con más frecuencia. °¨ La base del dedo gordo del pie. °¨ La rodilla. °¨ El tobillo. °¨ La muñeca. °¨ Un dedo. °Sin tratamiento, un ataque por lo general desaparece en unos pocos días o semanas. Entre un ataque y otro, por lo general no hay síntomas, lo cual es diferente de muchas otras formas de artritis.  °DIAGNÓSTICO  °El profesional reunirá la información basándose en los síntomas y el examen físico. En algunos casos indicará estudios. Estos estudios pueden ser:  °· Análisis de sangre. °· Análisis de orina. °· Radiografías. °· Análisis de los fluidos de la articulación. En este estudio se necesita una aguja para extraer líquido de la articulación (artrocentesis). Con el uso del microscopio, se confirma [name] cuando se observan los cristales de ácido úrico en el líquido de la articulación. °TRATAMIENTO  °Hay dos fases en el tratamiento de [name]: el tratamiento del ataque de aparición repentina (agudo) y la prevención de los ataques (profilaxis).  °· Tratamiento de un ataque agudo. °¨ Se utilizan medicamentos. Se indican antiinflamatorios o corticoides. °¨ En algunos casos es necesario inyectar un corticoide en la articulación afectada. °¨ La articulación dolorosa se pone en reposo. El movimiento puede empeorar la artritis. °¨ Puede utilizar tanto tratamientos con calor o con frío para aliviar el dolor en las articulaciones, según lo que le haga mejor. °· Tratamiento para prevenir ataques. °¨ Si sufre de ataques de Monique Parker frecuentes,   el médico puede recomendarle medicamentos preventivos. Estos medicamentos se administran después de que el ataque agudo mejora. Estos medicamentos ayudan a los riñones a eliminar el ácido úrico del  organismo o a disminuir la producción de ácido úrico. Es posible que deba utilizar estos medicamentos durante un largo tiempo. °¨ La primera fase del tratamiento preventiva puede asociarse con un aumento de los ataques agudos de Monique Parker. Por esta razón, durante los primeros meses de tratamiento, su médico puede también aconsejarle que tome medicamentos que habitualmente se utilizan para el tratamiento de [name] aguda. Asegúrese de comprender todas las indicaciones del médico. El médico podrá hacer varios ajustes a la dosis del medicamento antes de que comiencen a ser efectivos. °¨ Comente el tratamiento dietético con su médico o nutricionista. El alcohol y las bebidas que contienen gran cantidad de azúcar y fructosa y los alimentos como la carne, el pollo y los frutos de mar pueden aumentar los niveles de ácido úrico. El médico o el nutricionista podrá aconsejarlo sobre las bebidas y los alimentos que debe limitar. °INSTRUCCIONES PARA EL CUIDADO EN EL HOGAR  °· No tome aspirina para el dolor. Esto eleva los niveles de ácido úrico. °· Sólo tome medicamentos de venta libre o prescriptos para calmar el dolor, las molestias o bajar la fiebre según las indicaciones de su médico. °· Haga reposo todo el tiempo que pueda. Cuando se encuentre en la cama, mantenga las sábanas y mantas alejadas de las articulaciones doloridas. °· Mantenga la articulación afectada levantada (elevada). °· Aplique compresas frías o calientes sobre las articulaciones doloridas. el uso de compresas calientes o frías depende de lo que mejor le resulte a usted. °· Utilice muletas si la articulación que le duele es de la pierna. °· Debe ingerir gran cantidad de líquido para mantener la orina de tono claro o color amarillo pálido. Esto ayudará a que el organismo elimine el ácido úrico. Limite el consumo de alcohol, bebidas azucaradas y que contengan fructosa. °· Siga las indicaciones con respecto a la dieta. Preste especial atención a la cantidad de  proteínas que consume. En la dieta diaria debe enfatizar el consumo de frutas, vegetales, granos enteros y productos lácteos descremados. Comente con su médico o nutricionista acerca del consumo de café, vitamina C o cerezas. Estos pueden ayudar a disminuir los niveles de ácido úrico. °· Mantenga un peso corporal adecuado. °SOLICITE ATENCIÓN MÉDICA SI:  °· Tiene diarrea, vómitos o algún efecto secundario provocado por los medicamentos. °· No se siente mejor en 24 horas, o empeora. °SOLICITE ATENCIÓN MÉDICA DE INMEDIATO SI:  °· Las articulaciones le duelen más de manera repentina, tiene escalofríos o fiebre. °ASEGÚRESE DE QUE:  °· Comprende estas instrucciones. °· Controlará su enfermedad. °· Solicitará ayuda de inmediato si no mejora o si empeora. °Document Released: 05/28/2005 Document Revised: 12/13/2012 °ExitCare® Patient Information ©2015 ExitCare, LLC. This information is not intended to replace advice given to you by your health care provider. Make sure you discuss any questions you have with your health care provider. ° °

## 2014-09-05 ENCOUNTER — Encounter: Payer: Self-pay | Admitting: Radiology

## 2014-10-10 ENCOUNTER — Ambulatory Visit: Payer: Self-pay | Admitting: Family Medicine

## 2014-12-03 ENCOUNTER — Other Ambulatory Visit: Payer: Self-pay | Admitting: Family Medicine

## 2014-12-04 NOTE — Telephone Encounter (Signed)
Dr Katrinka BlazingSmith, I can't tell if you Rxd lisinopril for pt at 09/03/14 [facility] or not? The [facility] notes show that you ordered it at that visit, but then further down the list of ordered meds does not include it. You don't have any notes stating that you were Rxing it, and under "Chart Review" it shows up as "historical prescriber" and looks like it wasn't a completed order. Pt had reported at [facility] that she has been taking it and it "makes her cough". Please advise.

## 2015-03-25 ENCOUNTER — Ambulatory Visit (INDEPENDENT_AMBULATORY_CARE_PROVIDER_SITE_OTHER): Payer: Managed Care, Other (non HMO) | Admitting: Family Medicine

## 2015-03-25 VITALS — BP 148/90 | HR 64 | Temp 98.1°F | Resp 18 | Ht 63.5 in | Wt 172.8 lb

## 2015-03-25 DIAGNOSIS — M10071 Idiopathic gout, right ankle and foot: Secondary | ICD-10-CM

## 2015-03-25 DIAGNOSIS — I10 Essential (primary) hypertension: Secondary | ICD-10-CM | POA: Diagnosis not present

## 2015-03-25 DIAGNOSIS — H109 Unspecified conjunctivitis: Secondary | ICD-10-CM | POA: Diagnosis not present

## 2015-03-25 DIAGNOSIS — M25512 Pain in left shoulder: Secondary | ICD-10-CM | POA: Diagnosis not present

## 2015-03-25 DIAGNOSIS — M1000 Idiopathic gout, unspecified site: Secondary | ICD-10-CM

## 2015-03-25 MED ORDER — LISINOPRIL 20 MG PO TABS
20.0000 mg | ORAL_TABLET | Freq: Every day | ORAL | Status: DC
Start: 2015-03-25 — End: 2016-05-10

## 2015-03-25 MED ORDER — ALLOPURINOL 100 MG PO TABS: 100.0000 mg | ORAL_TABLET | Freq: Every day | ORAL | Status: DC

## 2015-03-25 MED ORDER — COLCHICINE 0.6 MG PO TABS: ORAL_TABLET | ORAL | Status: DC

## 2015-03-25 MED ORDER — NEOMYCIN-POLYMYXIN-DEXAMETH 3.5-10000-0.1 OP SUSP: 1.0000 [drp] | Freq: Two times a day (BID) | OPHTHALMIC | Status: DC | PRN

## 2015-03-25 NOTE — Patient Instructions (Signed)
Hypertension Hypertension, commonly called high blood pressure, is when the force of blood pumping through your arteries is too strong. Your arteries are the blood vessels that carry blood from your heart throughout your body. A blood pressure reading consists of a higher number over a lower number, such as 110/72. The higher number (systolic) is the pressure inside your arteries when your heart pumps. The lower number (diastolic) is the pressure inside your arteries when your heart relaxes. Ideally you want your blood pressure below 120/80. Hypertension forces your heart to work harder to pump blood. Your arteries may become narrow or stiff. Having hypertension puts you at risk for heart disease, stroke, and other problems.  RISK FACTORS Some risk factors for high blood pressure are controllable. Others are not.  Risk factors you cannot control include:   Race. You may be at higher risk if you are African American.  Age. Risk increases with age.  Gender. Men are at higher risk than women before age 45 years. After age 65, women are at higher risk than men. Risk factors you can control include:  Not getting enough exercise or physical activity.  Being overweight.  Getting too much fat, sugar, calories, or salt in your diet.  Drinking too much alcohol. SIGNS AND SYMPTOMS Hypertension does not usually cause signs or symptoms. Extremely high blood pressure (hypertensive crisis) may cause headache, anxiety, shortness of breath, and nosebleed. DIAGNOSIS  To check if you have hypertension, your health care provider will measure your blood pressure while you are seated, with your arm held at the level of your heart. It should be measured at least twice using the same arm. Certain conditions can cause a difference in blood pressure between your right and left arms. A blood pressure reading that is higher than normal on one occasion does not mean that you need treatment. If one blood pressure reading  is high, ask your health care provider about having it checked again. TREATMENT  Treating high blood pressure includes making lifestyle changes and possibly taking medicine. Living a healthy lifestyle can help lower high blood pressure. You may need to change some of your habits. Lifestyle changes may include:  Following the DASH diet. This diet is high in fruits, vegetables, and whole grains. It is low in salt, red meat, and added sugars.  Getting at least 2 hours of brisk physical activity every week.  Losing weight if necessary.  Not smoking.  Limiting alcoholic beverages.  Learning ways to reduce stress. If lifestyle changes are not enough to get your blood pressure under control, your health care provider may prescribe medicine. You may need to take more than one. Work closely with your health care provider to understand the risks and benefits. HOME CARE INSTRUCTIONS  Have your blood pressure rechecked as directed by your health care provider.   Take medicines only as directed by your health care provider. Follow the directions carefully. Blood pressure medicines must be taken as prescribed. The medicine does not work as well when you skip doses. Skipping doses also puts you at risk for problems.   Do not smoke.   Monitor your blood pressure at home as directed by your health care provider. SEEK MEDICAL CARE IF:   You think you are having a reaction to medicines taken.  You have recurrent headaches or feel dizzy.  You have swelling in your ankles.  You have trouble with your vision. SEEK IMMEDIATE MEDICAL CARE IF:  You develop a severe headache or confusion.    You have unusual weakness, numbness, or feel faint.  You have severe chest or abdominal pain.  You vomit repeatedly.  You have trouble breathing. MAKE SURE YOU:   Understand these instructions.  Will watch your condition.  Will get help right away if you are not doing well or get worse. Document  Released: 08/18/2005 Document Revised: 01/02/2014 Document Reviewed: 06/10/2013 ExitCare Patient Information 2015 [employer]. This information is not intended to replace advice given to you by your health care provider. Make sure you discuss any questions you have with your health care provider. Gout Gout is an inflammatory arthritis caused by a buildup of uric acid crystals in the joints. Uric acid is a chemical that is normally present in the blood. When the level of uric acid in the blood is too high it can form crystals that deposit in your joints and tissues. This causes joint redness, soreness, and swelling (inflammation). Repeat attacks are common. Over time, uric acid crystals can form into masses (tophi) near a joint, destroying bone and causing disfigurement. Gout is treatable and often preventable. CAUSES  The disease begins with elevated levels of uric acid in the blood. Uric acid is produced by your body when it breaks down a naturally found substance called purines. Certain foods you eat, such as meats and fish, contain high amounts of purines. Causes of an elevated uric acid level include:  Being passed down from parent to child (heredity).  Diseases that cause increased uric acid production (such as obesity, psoriasis, and certain cancers).  Excessive alcohol use.  Diet, especially diets rich in meat and seafood.  Medicines, including certain cancer-fighting medicines (chemotherapy), water pills (diuretics), and aspirin.  Chronic kidney disease. The kidneys are no longer able to remove uric acid well.  Problems with metabolism. Conditions strongly associated with gout include:  Obesity.  High blood pressure.  High cholesterol.  Diabetes. Not everyone with elevated uric acid levels gets gout. It is not understood why some people get gout and others do not. Surgery, joint injury, and eating too much of certain foods are some of the factors that can lead to gout  attacks. SYMPTOMS   An attack of gout comes on quickly. It causes intense pain with redness, swelling, and warmth in a joint.  Fever can occur.  Often, only one joint is involved. Certain joints are more commonly involved:  Base of the big toe.  Knee.  Ankle.  Wrist.  Finger. Without treatment, an attack usually goes away in a few days to weeks. Between attacks, you usually will not have symptoms, which is different from many other forms of arthritis. DIAGNOSIS  Your caregiver will suspect gout based on your symptoms and exam. In some cases, tests may be recommended. The tests may include:  Blood tests.  Urine tests.  X-rays.  Joint fluid exam. This exam requires a needle to remove fluid from the joint (arthrocentesis). Using a microscope, gout is confirmed when uric acid crystals are seen in the joint fluid. TREATMENT  There are two phases to gout treatment: treating the sudden onset (acute) attack and preventing attacks (prophylaxis).  Treatment of an Acute Attack.  Medicines are used. These include anti-inflammatory medicines or steroid medicines.  An injection of steroid medicine into the affected joint is sometimes necessary.  The painful joint is rested. Movement can worsen the arthritis.  You may use warm or cold treatments on painful joints, depending which works best for you.  Treatment to Prevent Attacks.  If you   suffer from frequent gout attacks, your caregiver may advise preventive medicine. These medicines are started after the acute attack subsides. These medicines either help your kidneys eliminate uric acid from your body or decrease your uric acid production. You may need to stay on these medicines for a very long time.  The early phase of treatment with preventive medicine can be associated with an increase in acute gout attacks. For this reason, during the first few months of treatment, your caregiver may also advise you to take medicines usually used  for acute gout treatment. Be sure you understand your caregiver's directions. Your caregiver may make several adjustments to your medicine dose before these medicines are effective.  Discuss dietary treatment with your caregiver or dietitian. Alcohol and drinks high in sugar and fructose and foods such as meat, poultry, and seafood can increase uric acid levels. Your caregiver or dietitian can advise you on drinks and foods that should be limited. HOME CARE INSTRUCTIONS   Do not take aspirin to relieve pain. This raises uric acid levels.  Only take over-the-counter or prescription medicines for pain, discomfort, or fever as directed by your caregiver.  Rest the joint as much as possible. When in bed, keep sheets and blankets off painful areas.  Keep the affected joint raised (elevated).  Apply warm or cold treatments to painful joints. Use of warm or cold treatments depends on which works best for you.  Use crutches if the painful joint is in your leg.  Drink enough fluids to keep your urine clear or pale yellow. This helps your body get rid of uric acid. Limit alcohol, sugary drinks, and fructose drinks.  Follow your dietary instructions. Pay careful attention to the amount of protein you eat. Your daily diet should emphasize fruits, vegetables, whole grains, and fat-free or low-fat milk products. Discuss the use of coffee, vitamin C, and cherries with your caregiver or dietitian. These may be helpful in lowering uric acid levels.  Maintain a healthy body weight. SEEK MEDICAL CARE IF:   You develop diarrhea, vomiting, or any side effects from medicines.  You do not feel better in 24 hours, or you are getting worse. SEEK IMMEDIATE MEDICAL CARE IF:   Your joint becomes suddenly more tender, and you have chills or a fever. MAKE SURE YOU:   Understand these instructions.  Will watch your condition.  Will get help right away if you are not doing well or get worse. Document Released:  08/15/2000 Document Revised: 01/02/2014 Document Reviewed: 03/31/2012 ExitCare Patient Information 2015 [employer]. This information is not intended to replace advice given to you by your health care provider. Make sure you discuss any questions you have with your health care provider.  

## 2015-03-25 NOTE — Addendum Note (Signed)
Addended by: Elvina Sidle on: 03/25/2015 02:15 PM   Modules accepted: Orders

## 2015-03-25 NOTE — Progress Notes (Addendum)
This chart was scribed for Elvina Sidle, MD by Stann Ore, medical scribe at [facility].The patient was seen in exam room 6 and the patient's care was started at 1:27 PM.  Patient ID: Monique Parker MRN: [id number], DOB: 05/07/1956, 59 y.o. Date of Encounter: 03/25/2015  Primary Physician: No PCP Per Patient  Chief Complaint:  Chief Complaint  Patient presents with   Toe Pain    both feet    Shoulder Pain    left shoulder     HPI:  Monique Parker is a 59 y.o. female who presents to [facility] complaining of pain in both toes.  She believes it's gout as she's had similar symptoms in the past. She has taken some ibuprofen.  She was in the office before and was prescribed colchicine and allopurinol.   She also complains of pain in her left shoulder. She has no pain in her left elbow.  She also says that her eyes have been itching for the past 2 weeks. She denies eye discharge.   She works as Production designer, theatre/television/film for [employer].   Past Medical History  Diagnosis Date   Hypertension    Hyperlipidemia    Gout    Glucose intolerance (impaired glucose tolerance)    Diabetes mellitus without complication      Home Meds: Prior to Admission medications   Medication Sig Start Date End Date Taking? Authorizing Provider  allopurinol (ZYLOPRIM) 100 MG tablet Take 1 tablet (100 mg total) by mouth daily. 09/03/14  Yes Ethelda Chick, MD  colchicine 0.6 MG tablet One twice to three times daily until gout pain is gone 09/03/14  Yes Ethelda Chick, MD  lisinopril (PRINIVIL,ZESTRIL) 20 MG tablet Take 20 mg by mouth daily.   Yes Historical Provider, MD  METFORMIN HCL PO Take 5 mg by mouth.   Yes Historical Provider, MD  clindamycin (CLEOCIN) 150 MG capsule Take 1 capsule (150 mg total) by mouth 3 (three) times daily. 09/03/14   Ethelda Chick, MD    Allergies:  Allergies  Allergen Reactions   Penicillins Swelling    History   Social History     Marital Status: Married    Spouse Name: N/A   Number of Children: N/A   Years of Education: N/A   Occupational History   Engineer, site    Social History Main Topics   Smoking status: Never Smoker    Smokeless tobacco: Not on file   Alcohol Use: No   Drug Use: No   Sexual Activity: Yes   Other Topics Concern   Not on file   Social History Narrative   Marital status: married      Children: none      Lives: with husband.     Employment:  employed     Review of Systems: Constitutional: negative for chills, fever, night sweats, weight changes, or fatigue  HEENT: negative for vision changes, hearing loss, congestion, rhinorrhea, ST, epistaxis, or sinus pressure; positive for eye discharge Cardiovascular: negative for chest pain or palpitations Respiratory: negative for hemoptysis, wheezing, shortness of breath, or cough Abdominal: negative for abdominal pain, nausea, vomiting, diarrhea, or constipation Dermatological: negative for rash Neurologic: negative for headache, dizziness, or syncope Musc: positive for toe pain (both), shoulder pain (left)  All other systems reviewed and are otherwise negative with the exception to those above and in the HPI.  Physical Exam: Blood pressure 148/90, pulse 64, temperature 98.1 F (  36.7 C), temperature source Oral, resp. rate 18, height 5' 3.5" (1.613 m), weight 172 lb 12.8 oz (78.382 kg), SpO2 99 %., Body mass index is 30.13 kg/(m^2). General: Well developed, well nourished, in no acute distress. Head: Normocephalic, atraumatic, eyes without discharge, sclera non-icteric, nares are without discharge. Bilateral auditory canals clear, TM's are without perforation, pearly grey and translucent with reflective cone of light bilaterally. Oral cavity moist, posterior pharynx without exudate, erythema, peritonsillar abscess, or post nasal drip.  Neck: Supple. No thyromegaly. Full ROM. No lymphadenopathy. Lungs: Clear bilaterally to  auscultation without wheezes, rales, or rhonchi. Breathing is unlabored. Heart: RRR with S1 S2. No murmurs, rubs, or gallops appreciated. Abdomen: Soft, non-tender, non-distended with normoactive bowel sounds. No hepatomegaly. No rebound/guarding. No obvious abdominal masses. Msk:  Strength and tone normal for age; bilateral bunion pain and tenderness over [facility] joint Extremities/Skin: Warm and dry. No clubbing or cyanosis. No rashes or suspicious lesions.  Both MTP joints of the great toes show bunions with marked lateral deviation and erythema with mild tenderness. Neuro: Alert and oriented X 3. Moves all extremities spontaneously. Gait is normal. CNII-XII grossly in tact. Psych:  Responds to questions appropriately with a normal affect.    ASSESSMENT AND PLAN:  59 y.o. year old female with  This chart was scribed in my presence and reviewed by me personally.    ICD-9-CM ICD-10-CM   1. Acute idiopathic gout, unspecified site 274.01 M10.00 colchicine 0.6 MG tablet     allopurinol (ZYLOPRIM) 100 MG tablet  2. Essential hypertension 401.9 I10 lisinopril (PRINIVIL,ZESTRIL) 20 MG tablet  3. Pain in joint, shoulder region, left 719.41 M25.512   4. Acute idiopathic gout of right foot 274.01 M10.071 colchicine 0.6 MG tablet     allopurinol (ZYLOPRIM) 100 MG tablet  5. Bilateral conjunctivitis 372.30 H10.9 neomycin-polymyxin b-dexamethasone (MAXITROL) 3.5-10000-0.1 SUSP     Signed, Elvina Sidle, MD 03/25/2015 2:15 PM

## 2016-01-30 ENCOUNTER — Other Ambulatory Visit: Payer: Self-pay | Admitting: Physician Assistant

## 2016-01-30 DIAGNOSIS — Z0000 Encounter for general adult medical examination without abnormal findings: Secondary | ICD-10-CM

## 2016-05-10 ENCOUNTER — Ambulatory Visit (INDEPENDENT_AMBULATORY_CARE_PROVIDER_SITE_OTHER): Payer: Managed Care, Other (non HMO) | Admitting: Physician Assistant

## 2016-05-10 VITALS — BP 118/72 | HR 59 | Temp 97.9°F | Resp 18 | Ht 63.5 in | Wt 170.8 lb

## 2016-05-10 DIAGNOSIS — I10 Essential (primary) hypertension: Secondary | ICD-10-CM | POA: Diagnosis not present

## 2016-05-10 DIAGNOSIS — M10071 Idiopathic gout, right ankle and foot: Secondary | ICD-10-CM

## 2016-05-10 DIAGNOSIS — Z1159 Encounter for screening for other viral diseases: Secondary | ICD-10-CM | POA: Diagnosis not present

## 2016-05-10 DIAGNOSIS — E119 Type 2 diabetes mellitus without complications: Secondary | ICD-10-CM

## 2016-05-10 DIAGNOSIS — Z114 Encounter for screening for human immunodeficiency virus [HIV]: Secondary | ICD-10-CM | POA: Diagnosis not present

## 2016-05-10 DIAGNOSIS — R001 Bradycardia, unspecified: Secondary | ICD-10-CM

## 2016-05-10 DIAGNOSIS — Z0000 Encounter for general adult medical examination without abnormal findings: Secondary | ICD-10-CM

## 2016-05-10 DIAGNOSIS — M1000 Idiopathic gout, unspecified site: Secondary | ICD-10-CM | POA: Diagnosis not present

## 2016-05-10 DIAGNOSIS — Z8639 Personal history of other endocrine, nutritional and metabolic disease: Secondary | ICD-10-CM

## 2016-05-10 LAB — COMPLETE METABOLIC PANEL WITH GFR
ALT: 15 U/L (ref 6–29)
AST: 26 U/L (ref 10–35)
Albumin: 4.5 g/dL (ref 3.6–5.1)
Alkaline Phosphatase: 65 U/L (ref 33–130)
BUN: 22 mg/dL (ref 7–25)
CO2: 27 mmol/L (ref 20–31)
Calcium: 9.4 mg/dL (ref 8.6–10.4)
Chloride: 103 mmol/L (ref 98–110)
Creat: 0.77 mg/dL (ref 0.50–0.99)
GFR, EST NON AFRICAN AMERICAN: 84 mL/min (ref >=60)
GFR, Est African American: >89 mL/min (ref >=60)
GLUCOSE: 108 mg/dL — AB (ref 65–99)
POTASSIUM: 4.3 mmol/L (ref 3.5–5.3)
SODIUM: 142 mmol/L (ref 135–146)
Total Bilirubin: 0.5 mg/dL (ref 0.2–1.2)
Total Protein: 7.7 g/dL (ref 6.1–8.1)

## 2016-05-10 LAB — POCT CBC
Granulocyte percent: 57.9 %G (ref 37–80)
HCT, POC: 41.1 % (ref 37.7–47.9)
Hemoglobin: 14.5 g/dL (ref 12.2–16.2)
Lymph, poc: 2.5 (ref 0.6–3.4)
MCH, POC: 30.7 pg (ref 27–31.2)
MCHC: 35.2 g/dL (ref 31.8–35.4)
MCV: 87 fL (ref 80–97)
MID (CBC): 0.3 (ref 0–0.9)
MPV: 9.1 fL (ref 0–99.8)
PLATELET COUNT, POC: 200 10*3/uL (ref 142–424)
POC Granulocyte: 3.9 (ref 2–6.9)
POC LYMPH %: 37.3 % (ref 10–50)
POC MID %: 4.8 %M (ref 0–12)
RBC: 4.73 M/uL (ref 4.04–5.48)
RDW, POC: 14.3 %
WBC: 6.7 10*3/uL (ref 4.6–10.2)

## 2016-05-10 LAB — HEPATITIS C ANTIBODY: HCV AB: NEGATIVE

## 2016-05-10 LAB — POCT GLYCOSYLATED HEMOGLOBIN (HGB A1C): Hemoglobin A1C: 6.6

## 2016-05-10 LAB — URIC ACID: Uric Acid, Serum: 8.1 mg/dL — ABNORMAL HIGH (ref 2.5–7.0)

## 2016-05-10 LAB — HIV ANTIBODY (ROUTINE TESTING W REFLEX): HIV 1&2 Ab, 4th Generation: NONREACTIVE

## 2016-05-10 LAB — TSH: TSH: 1.51 mIU/L

## 2016-05-10 MED ORDER — LISINOPRIL 20 MG PO TABS: 20.0000 mg | ORAL_TABLET | Freq: Every day | ORAL | 3 refills | Status: DC

## 2016-05-10 MED ORDER — METFORMIN HCL 500 MG PO TABS: 500.0000 mg | ORAL_TABLET | Freq: Every day | ORAL | 1 refills | Status: DC

## 2016-05-10 MED ORDER — COLCHICINE 0.6 MG PO TABS: ORAL_TABLET | ORAL | 2 refills | Status: DC

## 2016-05-10 NOTE — Progress Notes (Signed)
05/10/2016 11:46 AM   DOB: 1956/03/15 / MRN: [id number]  SUBJECTIVE:  Monique Parker is a 60 y.o. female presenting for left first great toe MTP pain.  She would also like refills of all of her medications today.    She has taken colchicine in the past with good relief of her gout pain. She was prescribed allopurinol but has also been taking this for acute flares only, not for prophylaxis. She feels that today's symptoms are similar to past gout symptoms.    She has a history of HTN and takes Lisinopril for this.  Has a history of elevated A1c and takes metformin when she needs it.      She is allergic to penicillins.   She  has a past medical history of Diabetes mellitus without complication (HCC); Glucose intolerance (impaired glucose tolerance); Gout; Hyperlipidemia; and Hypertension.    She  reports that she has never smoked. She does not have any smokeless tobacco history on file. She reports that she does not drink alcohol or use drugs. She  reports that she currently engages in sexual activity. The patient  has no past surgical history on file.  Her family history includes Arthritis in her mother; Cancer in her mother; Diabetes in her mother; Seizures in her mother.  Review of Systems  Constitutional: Negative for fever.  Respiratory: Negative for cough.   Cardiovascular: Negative for chest pain.  Gastrointestinal: Negative for nausea.  Genitourinary: Negative for dysuria.  Skin: Positive for rash (erythema).  Neurological: Negative for tingling, sensory change, focal weakness and headaches.       Negative for paresthesia  Endo/Heme/Allergies: Negative for polydipsia.  Psychiatric/Behavioral: Negative for substance abuse.    The problem list and medications were reviewed and updated by myself where necessary and exist elsewhere in the encounter.   OBJECTIVE:  BP 118/72   Pulse (!) 59   Temp 97.9 F (36.6 C) (Oral)   Resp 18   Ht 5' 3.5" (1.613 m)   Wt 170 lb 12.8 oz  (77.5 kg)   SpO2 99%   BMI 29.78 kg/m   Physical Exam  Constitutional: She is oriented to person, place, and time.  Cardiovascular: Normal rate and regular rhythm.   Pulmonary/Chest: Effort normal and breath sounds normal.  Musculoskeletal: Normal range of motion. She exhibits edema.  Neurological: She is alert and oriented to person, place, and time.    Lab Results  Component Value Date   HGBA1C 6.6 05/10/2016   Lab Results  Component Value Date   LABURIC 7.0 09/03/2014   Results for orders placed or performed in visit on 05/10/16 (from the past 72 hour(s))  POCT CBC     Status: None   Collection Time: 05/10/16 11:29 AM  Result Value Ref Range   WBC 6.7 4.6 - 10.2 K/uL   Lymph, poc 2.5 0.6 - 3.4   POC LYMPH PERCENT 37.3 10 - 50 %L   MID (cbc) 0.3 0 - 0.9   POC MID % 4.8 0 - 12 %M   POC Granulocyte 3.9 2 - 6.9   Granulocyte percent 57.9 37 - 80 %G   RBC 4.73 4.04 - 5.48 M/uL   Hemoglobin 14.5 12.2 - 16.2 g/dL   HCT, POC 14.7 82.9 - 47.9 %   MCV 87.0 80 - 97 fL   MCH, POC 30.7 27 - 31.2 pg   MCHC 35.2 31.8 - 35.4 g/dL   RDW, POC 56.2 %   Platelet Count, POC 200  142 - 424 K/uL   MPV 9.1 0 - 99.8 fL  POCT glycosylated hemoglobin (Hb A1C)     Status: None   Collection Time: 05/10/16 11:33 AM  Result Value Ref Range   Hemoglobin A1C 6.6     No results found.  ASSESSMENT AND PLAN  Glean SalenMaribel was seen today for gout.  Diagnoses and all orders for this visit:  History of diabetes mellitus -     POCT glycosylated hemoglobin (Hb A1C)  Acute idiopathic gout of right foot -     Uric Acid  Essential hypertension -     POCT CBC -     COMPLETE METABOLIC PANEL WITH GFR  Bradycardia -     TSH  Screening for HIV (human immunodeficiency virus) -     HIV antibody  Need for hepatitis C screening test -     Hepatitis C antibody  Routine health maintenance    The patient is advised to call or return to clinic if she does not see an improvement in symptoms, or to  seek the care of the closest emergency department if she worsens with the above plan.   Deliah BostonMichael Taressa Rauh, MHS, PA-C [facility] 05/10/2016 11:46 AM

## 2016-05-10 NOTE — Patient Instructions (Signed)
     IF you received an x-ray today, you will receive an invoice from [facility]. Please contact [facility] at [phone] with questions or concerns regarding your invoice.   IF you received labwork today, you will receive an invoice from [facility]. Please contact [facility] at [phone] with questions or concerns regarding your invoice.   Our billing staff will not be able to assist you with questions regarding bills from these companies.  You will be contacted with the lab results as soon as they are available. The fastest way to get your results is to activate your My Chart account. Instructions are located on the last page of this paperwork. If you have not heard from us regarding the results in 2 weeks, please contact this office.      

## 2016-05-14 ENCOUNTER — Encounter: Payer: Self-pay | Admitting: Emergency Medicine

## 2016-06-10 ENCOUNTER — Ambulatory Visit
Admission: RE | Admit: 2016-06-10 | Discharge: 2016-06-10 | Disposition: A | Discharge status: Home / Self Care | Payer: Managed Care, Other (non HMO) | Source: Ambulatory Visit | Attending: Physician Assistant | Admitting: Physician Assistant

## 2016-06-10 DIAGNOSIS — Z1231 Encounter for screening mammogram for malignant neoplasm of breast: Secondary | ICD-10-CM | POA: Insufficient documentation

## 2016-06-10 DIAGNOSIS — Z0000 Encounter for general adult medical examination without abnormal findings: Secondary | ICD-10-CM

## 2016-11-10 ENCOUNTER — Other Ambulatory Visit: Payer: Self-pay | Admitting: Physician Assistant

## 2016-11-10 DIAGNOSIS — E119 Type 2 diabetes mellitus without complications: Secondary | ICD-10-CM

## 2017-04-18 ENCOUNTER — Encounter: Payer: Self-pay | Admitting: Gynecology

## 2017-04-18 ENCOUNTER — Ambulatory Visit
Admission: EM | Admit: 2017-04-18 | Discharge: 2017-04-18 | Disposition: A | Discharge status: Home / Self Care | Payer: Commercial Managed Care - PPO | Attending: Family Medicine | Admitting: Family Medicine

## 2017-04-18 DIAGNOSIS — H6120 Impacted cerumen, unspecified ear: Secondary | ICD-10-CM

## 2017-04-18 DIAGNOSIS — H6123 Impacted cerumen, bilateral: Secondary | ICD-10-CM

## 2017-04-18 DIAGNOSIS — H9201 Otalgia, right ear: Secondary | ICD-10-CM | POA: Diagnosis not present

## 2017-04-18 DIAGNOSIS — K1379 Other lesions of oral mucosa: Secondary | ICD-10-CM | POA: Diagnosis not present

## 2017-04-18 DIAGNOSIS — B001 Herpesviral vesicular dermatitis: Secondary | ICD-10-CM

## 2017-04-18 MED ORDER — VALACYCLOVIR HCL 1 G PO TABS: 1000.0000 mg | ORAL_TABLET | Freq: Two times a day (BID) | ORAL | 0 refills | Status: DC

## 2017-04-18 NOTE — ED Triage Notes (Signed)
Per patient the roof of her mouth hurt from her toothbrush while brushing her teeth. Patient also stated that her right ear popping.

## 2017-04-18 NOTE — ED Provider Notes (Signed)
[facility]    CSN: [id number] Arrival date & time: 04/18/17  1147     History   Chief Complaint Chief Complaint  Patient presents with  . Ear Problem  . Oral Pain    HPI Monique Parker is a 61 y.o. female.   Patient's here because of multiple problems problem #1 she reports pain in the right ear feels as if he does when she puts it going in a plane in ear feels stopped up that has been going on now for about 4 days or started about Tuesday this week. On Wednesday she startedirritation at the roof of mouth with scout worse. She states when she eats certain foods this worse is irritated. She denies any fever and denies any trauma to the area. She is allergic to penicillin's history diabetes hypertension hyperlipidemia and has occasional flare up of gout. Family medical history is positive for multiple GI cancers. Habits she's never smoked. No previous surgeries or operations.   The history is provided by the patient. No language interpreter was used.  Oral Pain  This is a new problem. The problem occurs constantly. The problem has not changed since onset.Pertinent negatives include no chest pain, no abdominal pain, no headaches and no shortness of breath. Nothing aggravates the symptoms. Nothing relieves the symptoms. She has tried nothing for the symptoms. The treatment provided no relief.    Past Medical History:  Diagnosis Date  . Diabetes mellitus without complication (HCC)   . Glucose intolerance (impaired glucose tolerance)   . Gout   . Hyperlipidemia   . Hypertension     Patient Active Problem List   Diagnosis Date Noted  . Gout 12/12/2012  . HTN (hypertension) 12/12/2012  . DM (diabetes mellitus) (HCC) 12/12/2012  . Other and unspecified hyperlipidemia 12/12/2012  . BMI 28.0-28.9,adult 12/12/2012    No past surgical history on file.  OB History    No data available       Home Medications    Prior to Admission medications   Medication Sig  Start Date End Date Taking? Authorizing Provider  colchicine 0.6 MG tablet One twice to three times daily until gout pain is gone.  Do not repeat for more than two days consecutively. 05/10/16  Yes Ofilia Neas, PA-C  lisinopril (PRINIVIL,ZESTRIL) 20 MG tablet Take 1 tablet (20 mg total) by mouth daily. 05/10/16  Yes Ofilia Neas, PA-C  metFORMIN (GLUCOPHAGE) 500 MG tablet TAKE 1 TABLET (500 MG TOTAL) BY MOUTH DAILY WITH BREAKFAST. 11/10/16  Yes Barnett Abu, [place] D, PA-C  neomycin-polymyxin b-dexamethasone (MAXITROL) 3.5-10000-0.1 SUSP Place 1 drop into both eyes 2 (two) times daily as needed. Patient not taking: Reported on 05/10/2016 03/25/15   Elvina Sidle, MD  valACYclovir (VALTREX) 1000 MG tablet Take 1 tablet (1,000 mg total) by mouth 2 (two) times daily. Once 04/18/17   Hassan Rowan, MD    Family History Family History  Problem Relation Age of Onset  . Cancer Mother        Brain cancer  . Seizures Mother   . Diabetes Mother   . Arthritis Mother   . Breast cancer Maternal Aunt 30    Social History Social History  Substance Use Topics  . Smoking status: Never Smoker  . Smokeless tobacco: Never Used  . Alcohol use No     Allergies   Penicillins   Review of Systems Review of Systems  HENT: Positive for dental problem and ear pain.   Respiratory: Negative for  shortness of breath.   Cardiovascular: Negative for chest pain.  Gastrointestinal: Negative for abdominal pain.  Neurological: Negative for headaches.  All other systems reviewed and are negative.    Physical Exam Triage Vital Signs ED Triage Vitals  Enc Vitals Group     BP 04/18/17 1207 128/71     Pulse Rate 04/18/17 1207 60     Resp 04/18/17 1207 16     Temp 04/18/17 1207 98.2 F (36.8 C)     Temp Source 04/18/17 1207 Oral     SpO2 04/18/17 1207 98 %     Weight 04/18/17 1205 170 lb (77.1 kg)     Height 04/18/17 1205 5\' 4"  (1.626 m)     Head Circumference --      Peak Flow --      Pain Score --        Pain Loc --      Pain Edu? --      Excl. in GC? --    No data found.   Updated Vital Signs BP 128/71 (BP Location: Left Arm)   Pulse 60   Temp 98.2 F (36.8 C) (Oral)   Resp 16   Ht 5\' 4"  (1.626 m)   Wt 170 lb (77.1 kg)   SpO2 98%   BMI 29.18 kg/m   Visual Acuity Right Eye Distance:   Left Eye Distance:   Bilateral Distance:    Right Eye Near:   Left Eye Near:    Bilateral Near:     Physical Exam  Constitutional: She is oriented to person, place, and time. She appears well-developed and well-nourished.  HENT:  Head: Normocephalic and atraumatic.  Right Ear: Tympanic membrane and external ear normal. A foreign body is present.  Left Ear: Tympanic membrane and external ear normal. A foreign body is present.  Nose: Mucosal edema present. No nasal deformity. Right sinus exhibits no maxillary sinus tenderness and no frontal sinus tenderness. Left sinus exhibits no maxillary sinus tenderness and no frontal sinus tenderness.  Mouth/Throat: Uvula is midline, oropharynx is clear and moist and mucous membranes are normal. Oral lesions present. No uvula swelling.    On the hard palate is a single aphthous ulcer or/fever blister which is apparently cause for pain and discomfort  Eyes: Pupils are equal, round, and reactive to light. EOM are normal.  Neck: Normal range of motion. Neck supple. No tracheal deviation present.  Pulmonary/Chest: Effort normal.  Musculoskeletal: Normal range of motion. She exhibits no edema or deformity.  Neurological: She is alert and oriented to person, place, and time. No cranial nerve deficit.  Skin: Skin is warm. No erythema.  Psychiatric: She has a normal mood and affect.  Vitals reviewed.    UC Treatments / Results  Labs (all labs ordered are listed, but only abnormal results are displayed) Labs Reviewed - No data to display  EKG  EKG Interpretation None       Radiology No results found.  Procedures Procedures (including  critical care time)  Medications Ordered in UC Medications - No data to display   Initial Impression / Assessment and Plan / UC Course  I have reviewed the triage vital signs and the nursing notes.  Pertinent labs & imaging results that were available during my care of the patient were reviewed by me and considered in my medical decision making (see chart for details).   patient had both ears irrigated with a large removal of wax in both ears both TMs appear to  be normal so some wax present in the left ear canal. However the pain she had in the right ear for the aphthous ulcer or fever blister on the hard palate we'll place on Valtrex 4 g split dose 1 dose the morning when dose the evening. Follow-up PCP as needed work note given for today if needed.     Final Clinical Impressions(s) / UC Diagnoses   Final diagnoses:  Fever blister  Excessive cerumen in ear canal, unspecified laterality  Ear pain, right    New Prescriptions Discharge Medication List as of 04/18/2017 12:53 PM    START taking these medications   Details  valACYclovir (VALTREX) 1000 MG tablet Take 1 tablet (1,000 mg total) by mouth 2 (two) times daily. Once, Starting Sat 04/18/2017, Normal       Note: This dictation was prepared with Dragon dictation along with smaller phrase technology. Any transcriptional errors that result from this process are unintentional.  Controlled Substance Prescriptions  Controlled Substance Registry consulted? Not Applicable   Hassan Rowan, MD 04/18/17 1331

## 2017-05-13 ENCOUNTER — Other Ambulatory Visit: Payer: Self-pay | Admitting: Physician Assistant

## 2017-05-13 DIAGNOSIS — I10 Essential (primary) hypertension: Secondary | ICD-10-CM

## 2017-06-10 ENCOUNTER — Other Ambulatory Visit: Payer: Self-pay | Admitting: Physician Assistant

## 2017-06-10 DIAGNOSIS — Z1231 Encounter for screening mammogram for malignant neoplasm of breast: Secondary | ICD-10-CM

## 2017-06-13 ENCOUNTER — Other Ambulatory Visit: Payer: Self-pay | Admitting: Physician Assistant

## 2017-06-13 DIAGNOSIS — I10 Essential (primary) hypertension: Secondary | ICD-10-CM

## 2017-07-09 ENCOUNTER — Ambulatory Visit
Admission: RE | Admit: 2017-07-09 | Discharge: 2017-07-09 | Disposition: A | Discharge status: Home / Self Care | Payer: Commercial Managed Care - PPO | Source: Ambulatory Visit | Attending: Physician Assistant | Admitting: Physician Assistant

## 2017-07-09 DIAGNOSIS — Z1231 Encounter for screening mammogram for malignant neoplasm of breast: Secondary | ICD-10-CM | POA: Diagnosis not present

## 2018-01-15 ENCOUNTER — Ambulatory Visit: Payer: Self-pay | Admitting: Physician Assistant

## 2018-01-20 ENCOUNTER — Ambulatory Visit: Payer: Commercial Managed Care - PPO | Admitting: Urgent Care

## 2018-01-20 ENCOUNTER — Encounter: Payer: Self-pay | Admitting: Urgent Care

## 2018-01-20 VITALS — BP 118/70 | HR 86 | Temp 98.0°F | Ht 64.0 in | Wt 168.6 lb

## 2018-01-20 DIAGNOSIS — B9789 Other viral agents as the cause of diseases classified elsewhere: Secondary | ICD-10-CM

## 2018-01-20 DIAGNOSIS — J069 Acute upper respiratory infection, unspecified: Secondary | ICD-10-CM | POA: Diagnosis not present

## 2018-01-20 DIAGNOSIS — M10071 Idiopathic gout, right ankle and foot: Secondary | ICD-10-CM

## 2018-01-20 MED ORDER — BENZONATATE 100 MG PO CAPS: 100.0000 mg | ORAL_CAPSULE | Freq: Three times a day (TID) | ORAL | 0 refills | Status: AC | PRN

## 2018-01-20 MED ORDER — COLCHICINE 0.6 MG PO TABS: ORAL_TABLET | ORAL | 1 refills | Status: AC

## 2018-01-20 MED ORDER — HYDROCODONE-HOMATROPINE 5-1.5 MG/5ML PO SYRP
5.0000 mL | ORAL_SOLUTION | Freq: Every evening | ORAL | 0 refills | Status: AC | PRN
Start: 2018-01-20 — End: ?

## 2018-01-20 NOTE — Patient Instructions (Addendum)
Hydrate well with at least 2 liters (1 gallon) of water daily. For sore throat try using a honey-based tea. Use 3 teaspoons of honey with juice squeezed from half lemon. Place shaved pieces of ginger into 1/2-1 cup of water and warm over stove top. Then mix the ingredients and repeat every 4 hours as needed. You may take  Tylenol with ibuprofen 400-600mg  every 6 hours for throat pain and inflammation.     Viral Respiratory Infection A respiratory infection is an illness that affects part of the respiratory system, such as the lungs, nose, or throat. Most respiratory infections are caused by either viruses or bacteria. A respiratory infection that is caused by a virus is called a viral respiratory infection. Common types of viral respiratory infections include:  A cold.  The flu (influenza).  A respiratory syncytial virus (RSV) infection.  How do I know if I have a viral respiratory infection? Most viral respiratory infections cause:  A stuffy or runny nose.  Yellow or green nasal discharge.  A cough.  Sneezing.  Fatigue.  Achy muscles.  A sore throat.  Sweating or chills.  A fever.  A headache.  How are viral respiratory infections treated? If influenza is diagnosed early, it may be treated with an antiviral medicine that shortens the length of time a person has symptoms. Symptoms of viral respiratory infections may be treated with over-the-counter and prescription medicines, such as:  Expectorants. These make it easier to cough up mucus.  Decongestant nasal sprays.  Health care providers do not prescribe antibiotic medicines for viral infections. This is because antibiotics are designed to kill bacteria. They have no effect on viruses. How do I know if I should stay home from work or school? To avoid exposing others to your respiratory infection, stay home if you have:  A fever.  A persistent cough.  A sore throat.  A runny nose.  Sneezing.  Muscles  aches.  Headaches.  Fatigue.  Weakness.  Chills.  Sweating.  Nausea.  Follow these instructions at home:  Rest as much as possible.  Take over-the-counter and prescription medicines only as told by your health care provider.  Drink enough fluid to keep your urine clear or pale yellow. This helps prevent dehydration and helps loosen up mucus.  Gargle with a salt-water mixture 3-4 times per day or as needed. To make a salt-water mixture, completely dissolve -1 tsp of salt in 1 cup of warm water.  Use nose drops made from salt water to ease congestion and soften raw skin around your nose.  Do not drink alcohol.  Do not use tobacco products, including cigarettes, chewing tobacco, and e-cigarettes. If you need help quitting, ask your health care provider. Contact a health care provider if:  Your symptoms last for 10 days or longer.  Your symptoms get worse over time.  You have a fever.  You have severe sinus pain in your face or forehead.  The glands in your jaw or neck become very swollen. Get help right away if:  You feel pain or pressure in your chest.  You have shortness of breath.  You faint or feel like you will faint.  You have severe and persistent vomiting.  You feel confused or disoriented. This information is not intended to replace advice given to you by your health care provider. Make sure you discuss any questions you have with your health care provider. Document Released: 05/28/2005 Document Revised: 01/24/2016 Document Reviewed: 01/24/2015 Elsevier Interactive Patient Education  2018 [employer] Inc[employer]     Gout Gout is painful swelling that can occur in some of your joints. Gout is a type of arthritis. This condition is caused by having too much uric acid in your body. Uric acid is a chemical that forms when your body breaks down substances called purines. Purines are important for building body proteins. When your body has too much uric acid, sharp  crystals can form and build up inside your joints. This causes pain and swelling. Gout attacks can happen quickly and be very painful (acute gout). Over time, the attacks can affect more joints and become more frequent (chronic gout). Gout can also cause uric acid to build up under your skin and inside your kidneys. What are the causes? This condition is caused by too much uric acid in your blood. This can occur because:  Your kidneys do not remove enough uric acid from your blood. This is the most common cause.  Your body makes too much uric acid. This can occur with some cancers and cancer treatments. It can also occur if your body is breaking down too many red blood cells (hemolytic anemia).  You eat too many foods that are high in purines. These foods include organ meats and some seafood. Alcohol, especially beer, is also high in purines.  A gout attack may be triggered by trauma or stress. What increases the risk? This condition is more likely to develop in people who:  Have a family history of gout.  Are female and middle-aged.  Are female and have gone through menopause.  Are obese.  Frequently drink alcohol, especially beer.  Are dehydrated.  Lose weight too quickly.  Have an organ transplant.  Have lead poisoning.  Take certain medicines, including aspirin, cyclosporine, diuretics, levodopa, and niacin.  Have kidney disease or psoriasis.  What are the signs or symptoms? An attack of acute gout happens quickly. It usually occurs in just one joint. The most common place is the big toe. Attacks often start at night. Other joints that may be affected include joints of the feet, ankle, knee, fingers, wrist, or elbow. Symptoms may include:  Severe pain.  Warmth.  Swelling.  Stiffness.  Tenderness. The affected joint may be very painful to touch.  Shiny, red, or purple skin.  Chills and fever.  Chronic gout may cause symptoms more frequently. More joints may be  involved. You may also have white or yellow lumps (tophi) on your hands or feet or in other areas near your joints. How is this diagnosed? This condition is diagnosed based on your symptoms, medical history, and physical exam. You may have tests, such as:  Blood tests to measure uric acid levels.  Removal of joint fluid with a needle (aspiration) to look for uric acid crystals.  X-rays to look for joint damage.  How is this treated? Treatment for this condition has two phases: treating an acute attack and preventing future attacks. Acute gout treatment may include medicines to reduce pain and swelling, including:  NSAIDs.  Steroids. These are strong anti-inflammatory medicines that can be taken by mouth (orally) or injected into a joint.  Colchicine. This medicine relieves pain and swelling when it is taken soon after an attack. It can be given orally or through an IV tube.  Preventive treatment may include:  Daily use of smaller doses of NSAIDs or colchicine.  Use of a medicine that reduces uric acid levels in your blood.  Changes to your diet. You may need  to see a specialist about healthy eating (dietitian).  Follow these instructions at home: During a Gout Attack  If directed, apply ice to the affected area: ? Put ice in a plastic bag. ? Place a towel between your skin and the bag. ? Leave the ice on for 20 minutes, 2-3 times a day.  Rest the joint as much as possible. If the affected joint is in your leg, you may be given crutches to use.  Raise (elevate) the affected joint above the level of your heart as often as possible.  Drink enough fluids to keep your urine clear or pale yellow.  Take over-the-counter and prescription medicines only as told by your health care provider.  Do not drive or operate heavy machinery while taking prescription pain medicine.  Follow instructions from your health care provider about eating or drinking restrictions.  Return to your  normal activities as told by your health care provider. Ask your health care provider what activities are safe for you. Avoiding Future Gout Attacks  Follow a low-purine diet as told by your dietitian or health care provider. Avoid foods and drinks that are high in purines, including liver, kidney, anchovies, asparagus, herring, mushrooms, mussels, and beer.  Limit alcohol intake to no more than 1 drink a day for nonpregnant women and 2 drinks a day for men. One drink equals 12 oz of beer, 5 oz of wine, or 1 oz of hard liquor.  Maintain a healthy weight or lose weight if you are overweight. If you want to lose weight, talk with your health care provider. It is important that you do not lose weight too quickly.  Start or maintain an exercise program as told by your health care provider.  Drink enough fluids to keep your urine clear or pale yellow.  Take over-the-counter and prescription medicines only as told by your health care provider.  Keep all follow-up visits as told by your health care provider. This is important. Contact a health care provider if:  You have another gout attack.  You continue to have symptoms of a gout attack after10 days of treatment.  You have side effects from your medicines.  You have chills or a fever.  You have burning pain when you urinate.  You have pain in your lower back or belly. Get help right away if:  You have severe or uncontrolled pain.  You cannot urinate. This information is not intended to replace advice given to you by your health care provider. Make sure you discuss any questions you have with your health care provider. Document Released: 08/15/2000 Document Revised: 01/24/2016 Document Reviewed: 05/31/2015 Elsevier Interactive Patient Education  2018 [employer].     IF you received an x-ray today, you will receive an invoice from [facility]. Please contact [facility] at [phone] with questions or  concerns regarding your invoice.   IF you received labwork today, you will receive an invoice from [place]. Please contact [facility] at [phone] with questions or concerns regarding your invoice.   Our billing staff will not be able to assist you with questions regarding bills from these companies.  You will be contacted with the lab results as soon as they are available. The fastest way to get your results is to activate your My Chart account. Instructions are located on the last page of this paperwork. If you have not heard from [place] regarding the results in 2 weeks, please contact this office.

## 2018-01-20 NOTE — Progress Notes (Signed)
   MRN: [id number] DOB: 1956/06/01  Subjective:   Monique Parker is a 62 y.o. female presenting for 4 day history of right great toe pain. She has a longstanding history of gout, last episode was last year. She has used colchicine very successfully for this. Also reports 2 day history of sneezing, watery eyes, sore throat, runny nose, dry cough, itchy ears. Dry cough elicits mid-sternal chest pain. Denies sinus pain, ear pain, shob, n/v, abdominal pain, rashes. Denies smoking cigarettes.   Monique Parker has a current medication list which includes the following prescription(s): colchicine, lisinopril, and metformin. Also is allergic to penicillins.  Monique Parker  has a past medical history of Diabetes mellitus without complication (HCC), Glucose intolerance (impaired glucose tolerance), Gout, Hyperlipidemia, and Hypertension. Denies past surgical history.   Objective:   Vitals: BP 118/70 (BP Location: Left Arm, Patient Position: Sitting, Cuff Size: Normal)   Pulse 86   Temp 98 F (36.7 C) (Oral)   Ht  (1.626 m)   Wt 168 lb 9.6 oz (76.5 kg)   SpO2 99%   BMI 28.94 kg/m   Physical Exam  Constitutional: She is oriented to person, place, and time. She appears well-developed and well-nourished.  HENT:  TM's intact bilaterally, no effusions or erythema. Nasal turbinates dry, nasal passages patent. No sinus tenderness. Oropharynx with thick post-nasal drainage, mucous membranes moist.  Eyes: Right eye exhibits no discharge. Left eye exhibits no discharge. No scleral icterus.  Neck: Normal range of motion. Neck supple.  Bilateral mild cervical lymph node tenderness without adenopathy.  Cardiovascular: Normal rate, regular rhythm and intact distal pulses. Exam reveals no gallop and no friction rub.  No murmur heard. Pulmonary/Chest: No respiratory distress. She has no wheezes. She has no rales.  Neurological: She is alert and oriented to person, place, and time.  Skin: Skin is warm and dry.    Psychiatric: She has a normal mood and affect.   Assessment and Plan :   Acute idiopathic gout of right foot - Plan: colchicine 0.6 MG tablet, Uric Acid  Viral URI with cough  Refilled her colchicine.  Patient is likely undergoing viral upper respiratory infection.  Recommend supportive care.  Provided her with prescription for Hycodan and Tessalon for cough suppression. Counseled patient on potential for adverse effects with medications prescribed today, patient verbalized understanding. Return-to-clinic precautions discussed, patient verbalized understanding.  At the end of her visit patient wanted to do medication refills for her diabetes, high blood pressure and work-up of neuropathic type symptoms in her lower legs.  I counseled patient that we should set up an office visit for this as we had already completed our visit.  Patient was agreeable and will return to clinic.  Wallis Bamberg, PA-C [facility] [phone] 01/20/2018  1:31 PM

## 2018-01-21 LAB — URIC ACID: Uric Acid: 7.1 mg/dL (ref 2.5–7.1)

## 2018-07-07 ENCOUNTER — Other Ambulatory Visit: Payer: Self-pay | Admitting: Internal Medicine

## 2018-07-07 DIAGNOSIS — Z1231 Encounter for screening mammogram for malignant neoplasm of breast: Secondary | ICD-10-CM

## 2018-10-14 ENCOUNTER — Encounter (INDEPENDENT_AMBULATORY_CARE_PROVIDER_SITE_OTHER): Payer: Self-pay

## 2018-10-14 ENCOUNTER — Ambulatory Visit
Admission: RE | Admit: 2018-10-14 | Discharge: 2018-10-14 | Disposition: A | Discharge status: Home / Self Care | Payer: Commercial Managed Care - PPO | Source: Ambulatory Visit | Attending: Internal Medicine | Admitting: Internal Medicine

## 2018-10-14 DIAGNOSIS — Z1231 Encounter for screening mammogram for malignant neoplasm of breast: Secondary | ICD-10-CM | POA: Insufficient documentation

## 2019-01-07 DIAGNOSIS — Z8739 Personal history of other diseases of the musculoskeletal system and connective tissue: Secondary | ICD-10-CM | POA: Diagnosis not present

## 2019-01-07 DIAGNOSIS — E119 Type 2 diabetes mellitus without complications: Secondary | ICD-10-CM | POA: Diagnosis not present

## 2019-01-07 DIAGNOSIS — I10 Essential (primary) hypertension: Secondary | ICD-10-CM | POA: Diagnosis not present

## 2019-01-17 DIAGNOSIS — H1013 Acute atopic conjunctivitis, bilateral: Secondary | ICD-10-CM | POA: Diagnosis not present

## 2019-02-28 ENCOUNTER — Other Ambulatory Visit: Payer: Self-pay | Admitting: Orthopedic Surgery

## 2019-02-28 DIAGNOSIS — M25362 Other instability, left knee: Secondary | ICD-10-CM

## 2019-03-01 ENCOUNTER — Ambulatory Visit
Admission: RE | Admit: 2019-03-01 | Discharge: 2019-03-01 | Disposition: A | Discharge status: Home / Self Care | Payer: Commercial Managed Care - PPO | Source: Ambulatory Visit | Attending: Orthopedic Surgery | Admitting: Orthopedic Surgery

## 2019-03-01 ENCOUNTER — Other Ambulatory Visit: Payer: Self-pay

## 2019-03-01 DIAGNOSIS — M25362 Other instability, left knee: Secondary | ICD-10-CM | POA: Diagnosis present

## 2019-06-20 ENCOUNTER — Other Ambulatory Visit: Payer: Self-pay | Admitting: Internal Medicine

## 2019-06-20 DIAGNOSIS — Z1231 Encounter for screening mammogram for malignant neoplasm of breast: Secondary | ICD-10-CM

## 2019-10-17 ENCOUNTER — Other Ambulatory Visit: Payer: Self-pay

## 2019-10-17 ENCOUNTER — Ambulatory Visit
Admission: RE | Admit: 2019-10-17 | Discharge: 2019-10-17 | Disposition: A | Discharge status: Home / Self Care | Payer: Commercial Managed Care - PPO | Source: Ambulatory Visit | Attending: Internal Medicine | Admitting: Internal Medicine

## 2019-10-17 DIAGNOSIS — Z1231 Encounter for screening mammogram for malignant neoplasm of breast: Secondary | ICD-10-CM | POA: Insufficient documentation

## 2020-10-23 ENCOUNTER — Other Ambulatory Visit: Payer: Self-pay | Admitting: Obstetrics and Gynecology

## 2020-10-23 DIAGNOSIS — Z1231 Encounter for screening mammogram for malignant neoplasm of breast: Secondary | ICD-10-CM

## 2020-11-07 ENCOUNTER — Ambulatory Visit: Payer: Commercial Managed Care - PPO

## 2021-02-12 ENCOUNTER — Ambulatory Visit
Admission: RE | Admit: 2021-02-12 | Discharge: 2021-02-12 | Disposition: A | Discharge status: Home / Self Care | Payer: 59 | Source: Ambulatory Visit | Attending: Obstetrics and Gynecology | Admitting: Obstetrics and Gynecology

## 2021-02-12 ENCOUNTER — Other Ambulatory Visit: Payer: Self-pay

## 2021-02-12 DIAGNOSIS — Z1231 Encounter for screening mammogram for malignant neoplasm of breast: Secondary | ICD-10-CM | POA: Insufficient documentation

## 2021-02-19 ENCOUNTER — Other Ambulatory Visit: Payer: Self-pay | Admitting: Obstetrics and Gynecology

## 2021-02-19 DIAGNOSIS — R928 Other abnormal and inconclusive findings on diagnostic imaging of breast: Secondary | ICD-10-CM

## 2021-02-19 DIAGNOSIS — R921 Mammographic calcification found on diagnostic imaging of breast: Secondary | ICD-10-CM

## 2021-02-19 DIAGNOSIS — N6489 Other specified disorders of breast: Secondary | ICD-10-CM

## 2021-03-18 ENCOUNTER — Ambulatory Visit
Admission: RE | Admit: 2021-03-18 | Discharge: 2021-03-18 | Disposition: A | Discharge status: Home / Self Care | Payer: 59 | Source: Ambulatory Visit | Attending: Obstetrics and Gynecology | Admitting: Obstetrics and Gynecology

## 2021-03-18 ENCOUNTER — Other Ambulatory Visit: Payer: Self-pay

## 2021-03-18 DIAGNOSIS — R928 Other abnormal and inconclusive findings on diagnostic imaging of breast: Secondary | ICD-10-CM | POA: Insufficient documentation

## 2021-03-18 DIAGNOSIS — N6489 Other specified disorders of breast: Secondary | ICD-10-CM | POA: Insufficient documentation

## 2021-03-18 DIAGNOSIS — R921 Mammographic calcification found on diagnostic imaging of breast: Secondary | ICD-10-CM

## 2021-04-03 ENCOUNTER — Ambulatory Visit: Admission: RE | Admit: 2021-04-03 | Payer: 59 | Source: Ambulatory Visit

## 2021-04-03 ENCOUNTER — Other Ambulatory Visit: Payer: Self-pay

## 2021-04-03 ENCOUNTER — Ambulatory Visit
Admission: RE | Admit: 2021-04-03 | Discharge: 2021-04-03 | Disposition: A | Discharge status: Home / Self Care | Payer: 59 | Source: Ambulatory Visit | Attending: Obstetrics and Gynecology | Admitting: Obstetrics and Gynecology

## 2021-04-03 DIAGNOSIS — N6489 Other specified disorders of breast: Secondary | ICD-10-CM | POA: Diagnosis present

## 2021-04-03 DIAGNOSIS — R928 Other abnormal and inconclusive findings on diagnostic imaging of breast: Secondary | ICD-10-CM | POA: Diagnosis present

## 2021-04-03 DIAGNOSIS — R921 Mammographic calcification found on diagnostic imaging of breast: Secondary | ICD-10-CM | POA: Insufficient documentation

## 2021-09-23 ENCOUNTER — Other Ambulatory Visit: Payer: Self-pay | Admitting: Emergency Medicine

## 2021-09-25 ENCOUNTER — Other Ambulatory Visit: Payer: Self-pay | Admitting: Internal Medicine

## 2021-09-25 DIAGNOSIS — R921 Mammographic calcification found on diagnostic imaging of breast: Secondary | ICD-10-CM

## 2021-10-21 ENCOUNTER — Other Ambulatory Visit: Payer: Self-pay

## 2021-10-29 ENCOUNTER — Other Ambulatory Visit: Payer: Self-pay

## 2021-10-29 ENCOUNTER — Ambulatory Visit
Admission: RE | Admit: 2021-10-29 | Discharge: 2021-10-29 | Disposition: A | Discharge status: Home / Self Care | Payer: 59 | Source: Ambulatory Visit | Attending: Internal Medicine | Admitting: Internal Medicine

## 2021-10-29 DIAGNOSIS — R921 Mammographic calcification found on diagnostic imaging of breast: Secondary | ICD-10-CM | POA: Insufficient documentation

## 2021-12-12 ENCOUNTER — Other Ambulatory Visit: Payer: Self-pay | Admitting: Obstetrics and Gynecology

## 2021-12-12 DIAGNOSIS — R921 Mammographic calcification found on diagnostic imaging of breast: Secondary | ICD-10-CM

## 2022-02-25 ENCOUNTER — Ambulatory Visit
Admission: RE | Admit: 2022-02-25 | Discharge: 2022-02-25 | Disposition: A | Discharge status: Home / Self Care | Payer: BLUE CROSS/BLUE SHIELD | Source: Ambulatory Visit | Attending: Obstetrics and Gynecology | Admitting: Obstetrics and Gynecology

## 2022-02-25 DIAGNOSIS — R921 Mammographic calcification found on diagnostic imaging of breast: Secondary | ICD-10-CM | POA: Insufficient documentation

## 2022-03-15 IMAGING — MG MM DIGITAL DIAGNOSTIC UNILAT*R* W/ TOMO W/ CAD
8 series · 8 of 20 positions shown · non-contrast
Comparison: Previous exam(s).

CLINICAL DATA: Possible asymmetry with calcifications in the
posterior outer right breast on a recent screening mammogram.

EXAM:
DIGITAL DIAGNOSTIC UNILATERAL RIGHT MAMMOGRAM WITH TOMOSYNTHESIS AND
CAD
TECHNIQUE: Right digital diagnostic mammography and breast tomosynthesis was
performed. The images were evaluated with computer-aided detection.

[R ML]
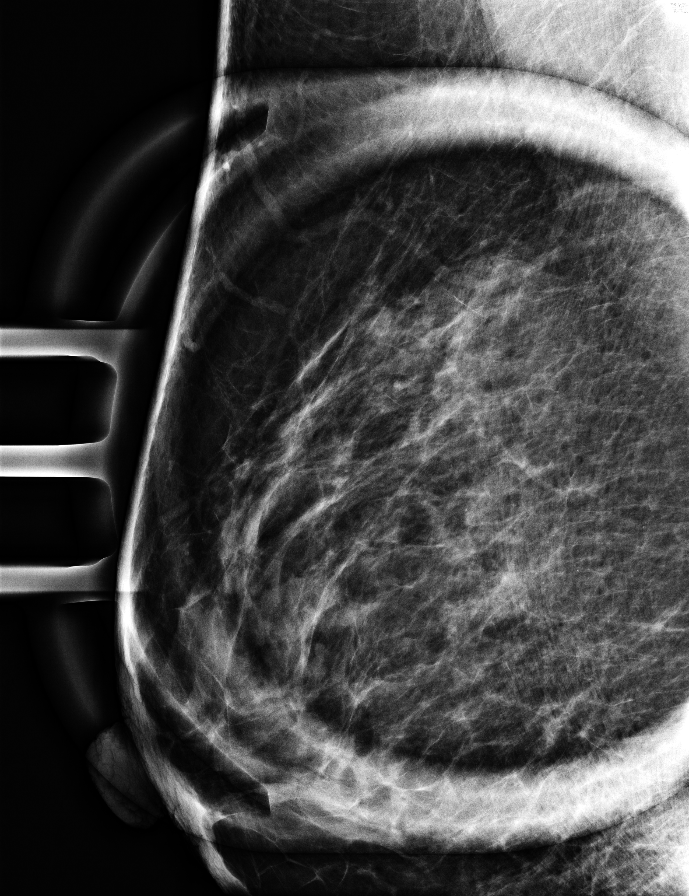

[R CC]
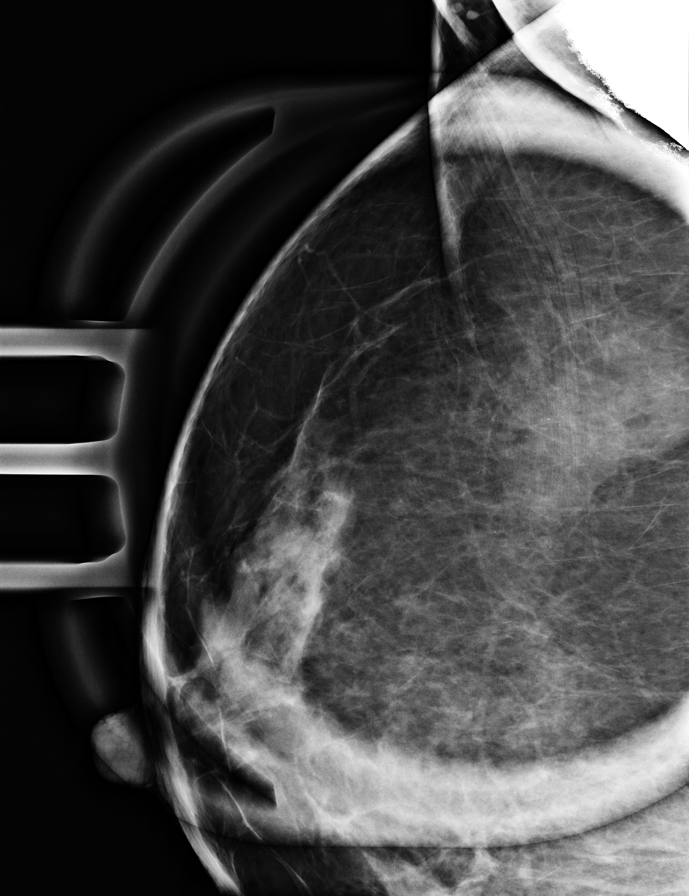

[R MLO synth-2D]
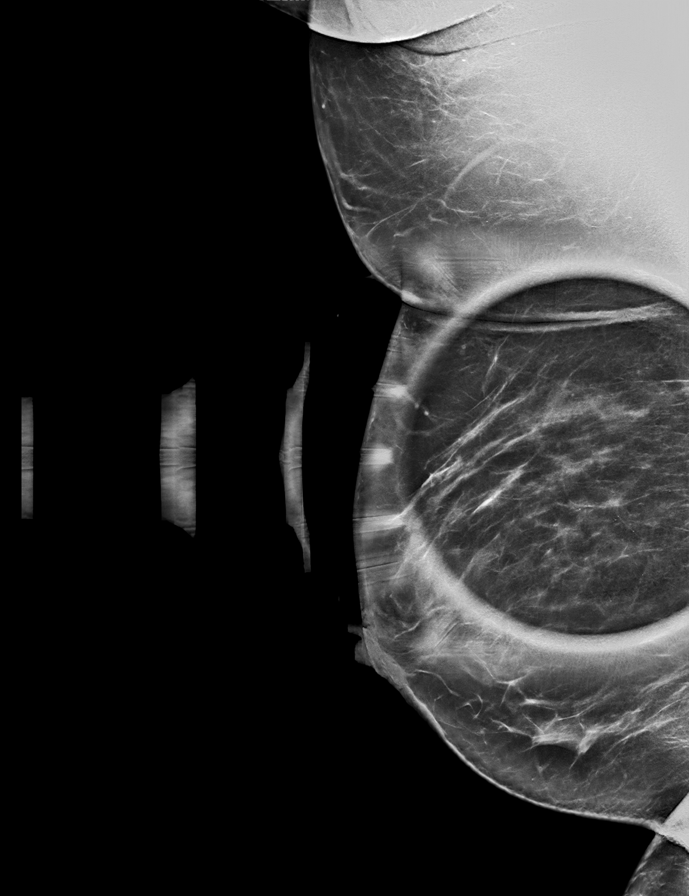

[R CC synth-2D]
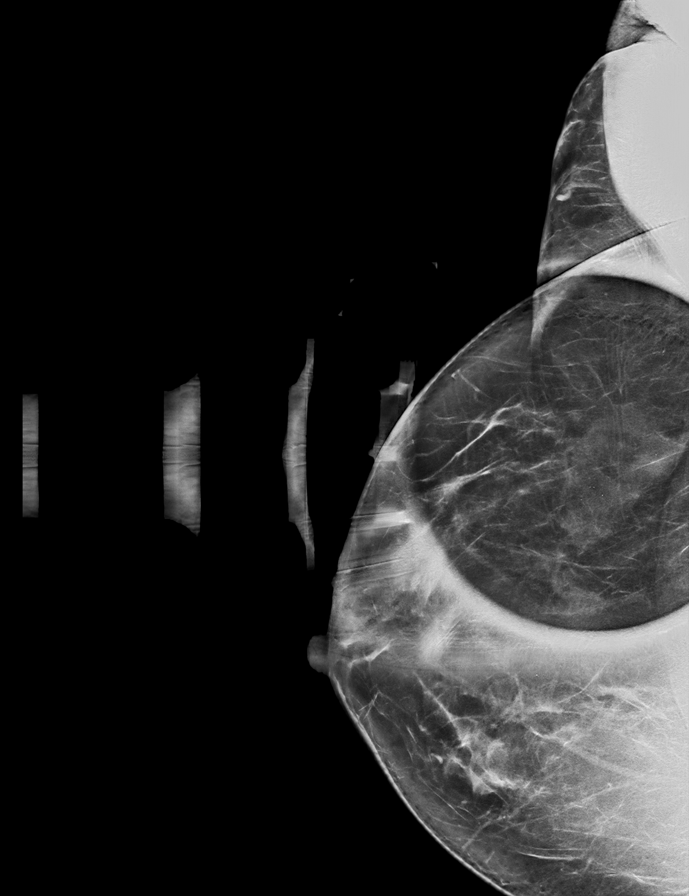

[R ML synth-2D]
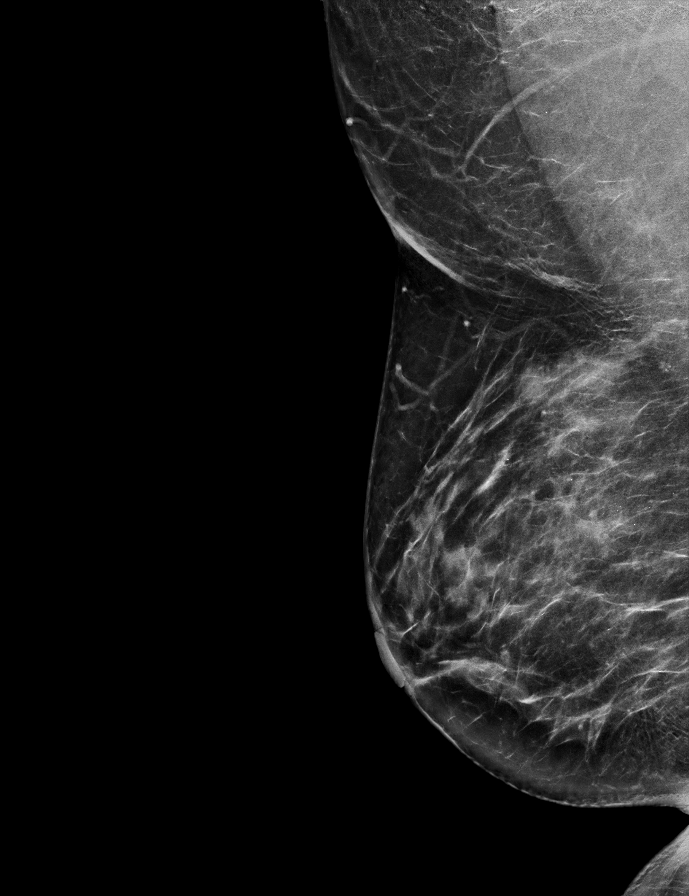

[R CC tomo · tomo slice 35/68.0]
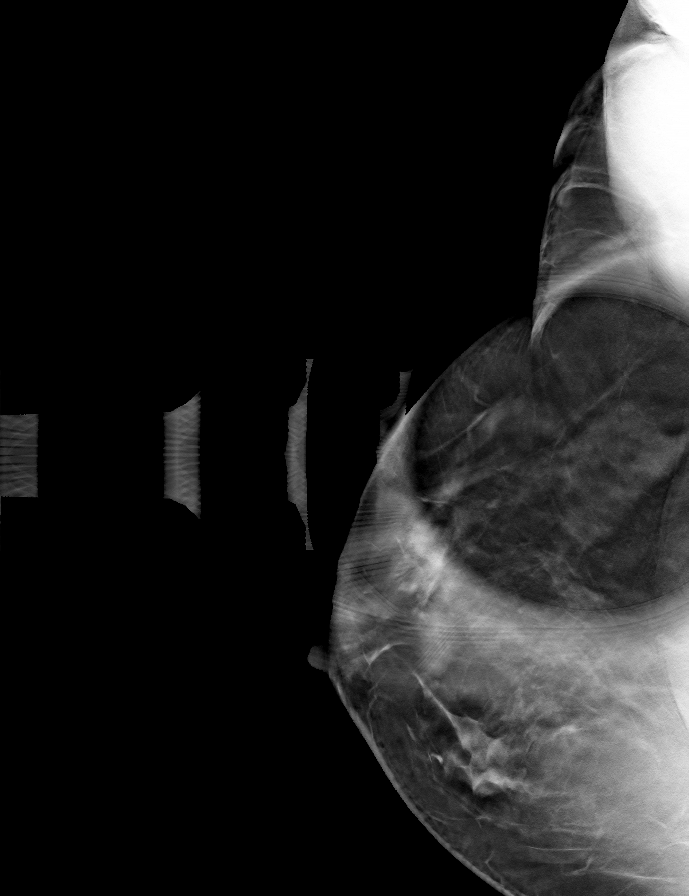

[R ML tomo · tomo slice 38/75.0]
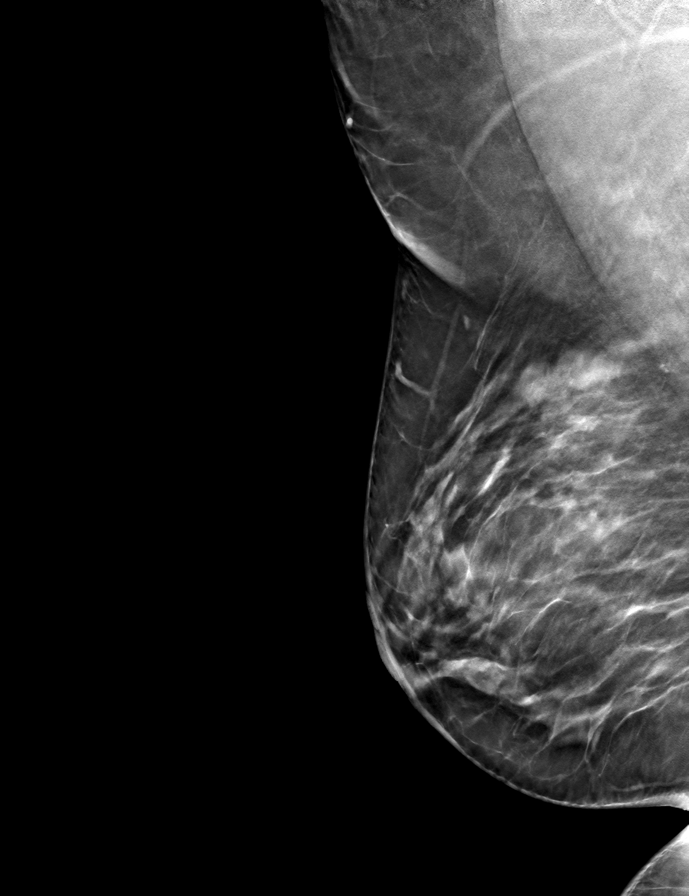

[R MLO tomo · tomo slice 35/69.0]
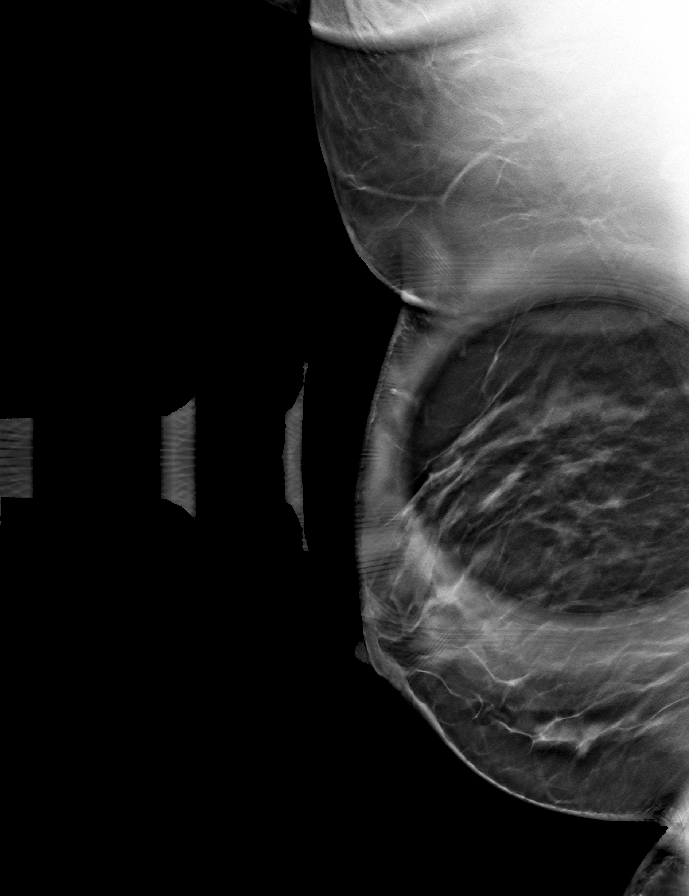

[8 of 20 positions shown; findings below may reference images not displayed]

ACR Breast Density Category c: The breast tissue is heterogeneously
dense, which may obscure small masses.
FINDINGS: 3D tomographic and 2D generated true lateral and spot compression
images of the right breast were obtained as well as 2D spot
magnification images. These demonstrate a mild area of asymmetrical
density in the posterior outer right breast, similar to previous
examinations. There are multiple tiny, rounded and oval, punctate
calcifications in that area of which have slightly increased in
number over multiple years. No mass or distortion seen.
IMPRESSION: Probably benign calcifications in the posterior outer right breast
in an area of mild chronic asymmetrical density, as described above.

RECOMMENDATION:
Right diagnostic mammogram in 6 months.

I have discussed the findings and recommendations with the patient.
If applicable, a reminder letter will be sent to the patient
regarding the next appointment.

BI-RADS CATEGORY  3: Probably benign.

## 2022-06-04 DIAGNOSIS — I10 Essential (primary) hypertension: Secondary | ICD-10-CM | POA: Diagnosis not present

## 2022-06-04 DIAGNOSIS — Z23 Encounter for immunization: Secondary | ICD-10-CM | POA: Diagnosis not present

## 2022-06-04 DIAGNOSIS — Z0000 Encounter for general adult medical examination without abnormal findings: Secondary | ICD-10-CM | POA: Diagnosis not present

## 2022-06-04 DIAGNOSIS — E785 Hyperlipidemia, unspecified: Secondary | ICD-10-CM | POA: Diagnosis not present

## 2022-06-04 DIAGNOSIS — E1140 Type 2 diabetes mellitus with diabetic neuropathy, unspecified: Secondary | ICD-10-CM | POA: Diagnosis not present

## 2023-01-28 ENCOUNTER — Encounter: Payer: Self-pay | Admitting: Internal Medicine

## 2023-01-30 ENCOUNTER — Other Ambulatory Visit: Payer: Self-pay | Admitting: Internal Medicine

## 2023-01-30 DIAGNOSIS — R921 Mammographic calcification found on diagnostic imaging of breast: Secondary | ICD-10-CM

## 2023-03-04 ENCOUNTER — Ambulatory Visit
Admission: RE | Admit: 2023-03-04 | Discharge: 2023-03-04 | Disposition: A | Discharge status: Home / Self Care | Payer: Medicare Other | Source: Ambulatory Visit | Attending: Internal Medicine | Admitting: Internal Medicine

## 2023-03-04 DIAGNOSIS — R921 Mammographic calcification found on diagnostic imaging of breast: Secondary | ICD-10-CM | POA: Diagnosis present

## 2024-02-03 ENCOUNTER — Other Ambulatory Visit: Payer: Self-pay | Admitting: Internal Medicine

## 2024-02-03 DIAGNOSIS — Z1231 Encounter for screening mammogram for malignant neoplasm of breast: Secondary | ICD-10-CM

## 2024-03-07 ENCOUNTER — Inpatient Hospital Stay: Admission: RE | Admit: 2024-03-07 | Source: Ambulatory Visit

## 2024-03-22 ENCOUNTER — Ambulatory Visit
Admission: RE | Admit: 2024-03-22 | Discharge: 2024-03-22 | Disposition: A | Discharge status: Home / Self Care | Source: Ambulatory Visit | Attending: Internal Medicine | Admitting: Internal Medicine

## 2024-03-22 DIAGNOSIS — Z1231 Encounter for screening mammogram for malignant neoplasm of breast: Secondary | ICD-10-CM | POA: Diagnosis present
